# Patient Record
Sex: Female | Born: 1975
Health system: Southern US, Community
[De-identification: ages and names within clinical notes are randomized; demographics above are authoritative.]

## PROBLEM LIST (undated history)

## (undated) ENCOUNTER — Inpatient Hospital Stay (HOSPITAL_COMMUNITY): Payer: Self-pay

## (undated) DIAGNOSIS — G43909 Migraine, unspecified, not intractable, without status migrainosus: Secondary | ICD-10-CM

## (undated) DIAGNOSIS — J189 Pneumonia, unspecified organism: Secondary | ICD-10-CM

## (undated) DIAGNOSIS — J40 Bronchitis, not specified as acute or chronic: Secondary | ICD-10-CM

## (undated) DIAGNOSIS — N200 Calculus of kidney: Secondary | ICD-10-CM

## (undated) DIAGNOSIS — Z319 Encounter for procreative management, unspecified: Secondary | ICD-10-CM

## (undated) HISTORY — PX: WISDOM TOOTH EXTRACTION: SHX21

## (undated) HISTORY — PX: KIDNEY STONE SURGERY: SHX686

## (undated) HISTORY — DX: Bronchitis, not specified as acute or chronic: J40

## (undated) HISTORY — DX: Migraine, unspecified, not intractable, without status migrainosus: G43.909

## (undated) HISTORY — DX: Pneumonia, unspecified organism: J18.9

---

## 2000-10-05 ENCOUNTER — Inpatient Hospital Stay (HOSPITAL_COMMUNITY): Admission: AD | Admit: 2000-10-05 | Discharge: 2000-10-05 | Payer: Self-pay | Admitting: Obstetrics

## 2013-10-06 DIAGNOSIS — O4112 Chorioamnionitis: Secondary | ICD-10-CM

## 2013-10-06 DIAGNOSIS — O139 Gestational [pregnancy-induced] hypertension without significant proteinuria, unspecified trimester: Secondary | ICD-10-CM

## 2015-10-01 ENCOUNTER — Ambulatory Visit (INDEPENDENT_AMBULATORY_CARE_PROVIDER_SITE_OTHER): Payer: Managed Care, Other (non HMO) | Admitting: Family Medicine

## 2015-10-01 VITALS — BP 104/66 | HR 80 | Temp 98.6°F | Resp 18 | Ht 67.0 in | Wt 135.0 lb

## 2015-10-01 DIAGNOSIS — J0101 Acute recurrent maxillary sinusitis: Secondary | ICD-10-CM | POA: Diagnosis not present

## 2015-10-01 DIAGNOSIS — J411 Mucopurulent chronic bronchitis: Secondary | ICD-10-CM | POA: Diagnosis not present

## 2015-10-01 MED ORDER — FLUCONAZOLE 150 MG PO TABS: 150.0000 mg | ORAL_TABLET | Freq: Once | ORAL | 0 refills | Status: AC

## 2015-10-01 MED ORDER — AMOXICILLIN-POT CLAVULANATE 875-125 MG PO TABS: 1.0000 | ORAL_TABLET | Freq: Two times a day (BID) | ORAL | 0 refills | Status: DC

## 2015-10-01 NOTE — Progress Notes (Signed)
   HPI  Patient presents today here with possible sinus infection and to establish care.   Pt explains she has had cough (resolving) congestion, ear pain, muffled hearing, and BL facial pain for 7 days. Her facial pain is persistent and worsening. She is tolerating fluids easily.   She denies fever. She had severe sore throat which has resolved.   She explains symps started after being around a nephew at the beach, several others are sick as well.   PMH: Smoking status noted Pt states that she has had recurrent bronchitis requiring hospitalization 3 times this year, she has had PFTs in [place] without explanation.  Heart disease in father, Never smoker, no alcohol use.  ROS: Per HPI  Objective: BP 104/66   Pulse 80   Temp 98.6 F (37 C) (Oral)   Resp 18   Ht 5\' 7"  (1.702 m)   Wt 135 lb (61.2 kg)   LMP 09/25/2015   SpO2 100%   BMI 21.14 kg/m  Gen: NAD, alert, cooperative with exam HEENT: NCAT, BL maxillary and frontal sinus tendernes sto palp, TMS WNL BL, small amount of layering fluid on L, nares erythemetous Oropharynx clear CV: RRR, good S1/S2, no murmur Resp: CTABL, no wheezes, non-labored Ext: No edema, warm Neuro: Alert and oriented, No gross deficits  Assessment and plan:  # acute pansinusitis Treat with augmentin Diflucan for yeast infections, Pt states she gets very bad yeast infections with antibiotics Flonase or neti pot, RTC as needed  # Recurrent bronchitis Requiring Hospitalization X 3 this year, refer to pulmonology to discuss possible underlying lung disease Lung exam reassuring today.      Orders Placed This Encounter  Procedures  . Ambulatory referral to Pulmonology    Referral Priority:   Routine    Referral Type:   Consultation    Referral Reason:   Specialty Services Required    Requested Specialty:   Pulmonary Disease    Number of Visits Requested:   1    Meds ordered this encounter  Medications  . topiramate (TOPAMAX) 100 MG  tablet    Sig: Take 100 mg by mouth 2 (two) times daily.  . valACYclovir (VALTREX) 500 MG tablet    Sig: Take 500 mg by mouth 2 (two) times daily.  Marland Kitchen amoxicillin-clavulanate (AUGMENTIN) 875-125 MG tablet    Sig: Take 1 tablet by mouth 2 (two) times daily.    Dispense:  20 tablet    Refill:  0  . fluconazole (DIFLUCAN) 150 MG tablet    Sig: Take 1 tablet (150 mg total) by mouth once. Repeat in 3 days    Dispense:  3 tablet    Refill:  0    Kevin Fenton, MD 11:03 AM

## 2015-10-01 NOTE — Patient Instructions (Addendum)
  Great to meet you!   Sinusitis, Adult Sinusitis is redness, soreness, and puffiness (inflammation) of the air pockets in the bones of your face (sinuses). The redness, soreness, and puffiness can cause air and mucus to get trapped in your sinuses. This can allow germs to grow and cause an infection.  HOME CARE   Drink enough fluids to keep your pee (urine) clear or pale yellow.  Use a humidifier in your home.  Run a hot shower to create steam in the bathroom. Sit in the bathroom with the door closed. Breathe in the steam 3-4 times a day.  Put a warm, moist washcloth on your face 3-4 times a day, or as told by your doctor.  Use salt water sprays (saline sprays) to wet the thick fluid in your nose. This can help the sinuses drain.  Only take medicine as told by your doctor. GET HELP RIGHT AWAY IF:   Your pain gets worse.  You have very bad headaches.  You are sick to your stomach (nauseous).  You throw up (vomit).  You are very sleepy (drowsy) all the time.  Your face is puffy (swollen).  Your vision changes.  You have a stiff neck.  You have trouble breathing. MAKE SURE YOU:   Understand these instructions.  Will watch your condition.  Will get help right away if you are not doing well or get worse.   This information is not intended to replace advice given to you by your health care provider. Make sure you discuss any questions you have with your health care provider.   Document Released: 08/11/2007 Document Revised: 03/15/2014 Document Reviewed: 09/28/2011 Elsevier Interactive Patient Education 2016 [employer].      IF you received an x-ray today, you will receive an invoice from [facility]. Please contact [facility] at [phone] with questions or concerns regarding your invoice.   IF you received labwork today, you will receive an invoice from [employer]. Please contact [facility] at [phone] with  questions or concerns regarding your invoice.   Our billing staff will not be able to assist you with questions regarding bills from these companies.  You will be contacted with the lab results as soon as they are available. The fastest way to get your results is to activate your My Chart account. Instructions are located on the last page of this paperwork. If you have not heard from [place] regarding the results in 2 weeks, please contact this office.

## 2016-03-09 ENCOUNTER — Ambulatory Visit (INDEPENDENT_AMBULATORY_CARE_PROVIDER_SITE_OTHER): Payer: Managed Care, Other (non HMO) | Admitting: Physician Assistant

## 2016-03-09 VITALS — BP 122/80 | HR 84 | Temp 99.2°F | Resp 18 | Ht 67.0 in | Wt 143.0 lb

## 2016-03-09 DIAGNOSIS — Z3200 Encounter for pregnancy test, result unknown: Secondary | ICD-10-CM

## 2016-03-09 DIAGNOSIS — J3489 Other specified disorders of nose and nasal sinuses: Secondary | ICD-10-CM

## 2016-03-09 DIAGNOSIS — J069 Acute upper respiratory infection, unspecified: Secondary | ICD-10-CM | POA: Diagnosis not present

## 2016-03-09 DIAGNOSIS — R05 Cough: Secondary | ICD-10-CM

## 2016-03-09 DIAGNOSIS — R059 Cough, unspecified: Secondary | ICD-10-CM

## 2016-03-09 LAB — POCT CBC
GRANULOCYTE PERCENT: 79.4 % (ref 37–80)
HCT, POC: 40.4 % (ref 37.7–47.9)
Hemoglobin: 14.4 g/dL (ref 12.2–16.2)
Lymph, poc: 0.9 (ref 0.6–3.4)
MCH, POC: 32.9 pg — AB (ref 27–31.2)
MCHC: 35.7 g/dL — AB (ref 31.8–35.4)
MCV: 92.3 fL (ref 80–97)
MID (cbc): 0.4 (ref 0–0.9)
MPV: 6.8 fL (ref 0–99.8)
PLATELET COUNT, POC: 174 10*3/uL (ref 142–424)
POC Granulocyte: 4.8 (ref 2–6.9)
POC LYMPH %: 14.2 % (ref 10–50)
POC MID %: 6.4 %M (ref 0–12)
RBC: 4.38 M/uL (ref 4.04–5.48)
RDW, POC: 12.5 %
WBC: 6.1 10*3/uL (ref 4.6–10.2)

## 2016-03-09 LAB — POCT URINE PREGNANCY: Preg Test, Ur: NEGATIVE

## 2016-03-09 NOTE — Progress Notes (Signed)
MRN: [id number] DOB: November 27, 1975  Subjective:   Amy Yang is a 41 y.o. female presenting for chief complaint of Cough (patient states she may have bronchitits; started on last sat and getting worse) and Possible Pregnancy (patient wants to know if she is pregnant) .  Reports 5 day history of sinus congestion, rhinorrhea, ear pain, sore throat, dry cough (no hemoptysis), wheezing and shortness of breath, night sweats and chills. She now has a hoarse voice. Has not tried anything for relief as she thought she might be pregnant. Denies chest pain and myalgia, nausea, vomiting, abdominal pain and diarrhea. Has not had sick contact with anyone. No history of seasonal allergies or history of asthma. Patient has had flu shot this season. Denies smoking. Pt has history of bronchitis and pneumonia diagnosed without CXR.   Pt is actively trying to become pregnant. Her LMP 02/27/16  Amil AmenJulia has a current medication list which includes the following prescription(s): valacyclovir. Also is allergic to morphine and related; rhinocort [budesonide]; and symbicort [budesonide-formoterol fumarate].  Amil AmenJulia  has a past medical history of Bronchitis; Migraine; and Pneumonia. Also  has no past surgical history on file.   Objective:   Vitals: BP 122/80   Pulse 84   Temp 99.2 F (37.3 C) (Oral)   Resp 18   Ht 5\' 7"  (1.702 m)   Wt 143 lb (64.9 kg)   LMP 02/17/2016   SpO2 100%   BMI 22.40 kg/m   Physical Exam  Constitutional: She is oriented to person, place, and time. She appears well-developed and well-nourished. She appears distressed (mild, hacking cough persisting throughout exam).  HENT:  Head: Normocephalic and atraumatic.  Nose: Rhinorrhea present. Right sinus exhibits no maxillary sinus tenderness and no frontal sinus tenderness. Left sinus exhibits no maxillary sinus tenderness and no frontal sinus tenderness.  Mouth/Throat: Uvula is midline, oropharynx is clear and moist and mucous membranes  are normal. No tonsillar exudate.  Eyes: Conjunctivae are normal.  Neck: Normal range of motion.  Cardiovascular: Normal rate, regular rhythm and normal heart sounds.   Pulmonary/Chest: Effort normal and breath sounds normal.  Neurological: She is alert and oriented to person, place, and time.  Skin: Skin is warm and dry.  Psychiatric: She has a normal mood and affect.  Vitals reviewed.  Results for orders placed or performed in visit on 03/09/16 (from the past 24 hour(s))  POCT urine pregnancy     Status: None   Collection Time: 03/09/16 10:06 AM  Result Value Ref Range   Preg Test, Ur Negative Negative  POCT CBC     Status: Abnormal   Collection Time: 03/09/16 10:54 AM  Result Value Ref Range   WBC 6.1 4.6 - 10.2 K/uL   Lymph, poc 0.9 0.6 - 3.4   POC LYMPH PERCENT 14.2 10 - 50 %L   MID (cbc) 0.4 0 - 0.9   POC MID % 6.4 0 - 12 %M   POC Granulocyte 4.8 2 - 6.9   Granulocyte percent 79.4 37 - 80 %G   RBC 4.38 4.04 - 5.48 M/uL   Hemoglobin 14.4 12.2 - 16.2 g/dL   HCT, POC 40.940.4 81.137.7 - 47.9 %   MCV 92.3 80 - 97 fL   MCH, POC 32.9 (A) 27 - 31.2 pg   MCHC 35.7 (A) 31.8 - 35.4 g/dL   RDW, POC 91.412.5 %   Platelet Count, POC 174 142 - 424 K/uL   MPV 6.8 0 - 99.8 fL  Assessment and Plan :  1. Possible pregnancy - POCT urine pregnancy  2. Cough - POCT CBC  3. Rhinorrhea 4. Acute upper respiratory infection Pt reassured that her physical exam and lab findings support a viral etiology and that she can be treated symptomatically as she has not tried any medications at this point due to her unknown pregnancy status. She is actively trying to become pregnant. I recommended that we treat her symptoms with supportive care. Pt instructed to return to clinic if symptoms worsen, do not improve in 5-7 days, or as needed  Benjiman Core, PA-C  [facility] 03/09/2016 11:07 AM

## 2016-03-09 NOTE — Patient Instructions (Addendum)
-   We will treat this as a respiratory viral infection.  - I recommend you rest, drink plenty of fluids, eat light meals including soups.  - You may use sudafed for your congestion. For your cough, I recommend drinking warm tea with honey and lemon and using a humidifier in your room at night time.  - You may also use Tylenol over-the-counter for your sore throat.  - Please let me know if you are not seeing any improvement or get worse in 5-7 days.     Get ample rest - Take naps, sleep through the night, and sit down to relax. These are great ways to give your body much needed down time. Learn more about the importance of bed rest during pregnancy. Drink plenty of fluids - Drink water, juice, or broth to add necessary fluids back into your body. Eat well - Even if you cannot stomach larger meals, try eating small portions often. For your own comfort, it is important that you treat the symptoms associated with your cold or a cough.  Natural remedies to some of your most bothersome symptoms include:  Reduce congestion - Place a humidifier in your room, keep your head elevated on your pillow while resting, or use nasal strips. Alleviate your sore throat - Suck on ice chips, drink warm tea, or gargle with warm salt water.  IF you received an x-ray today, you will receive an invoice from [facility]. Please contact [facility] at [phone] with questions or concerns regarding your invoice.   IF you received labwork today, you will receive an invoice from [facility]. Please contact [facility] at [phone] with questions or concerns regarding your invoice.   Our billing staff will not be able to assist you with questions regarding bills from these companies.  You will be contacted with the lab results as soon as they are available. The fastest way to get your results is to activate your My Chart account. Instructions are located on the last page of this paperwork. If you have not  heard from us regarding the results in 2 weeks, please contact this office.

## 2016-04-12 ENCOUNTER — Telehealth: Payer: Self-pay

## 2016-04-12 ENCOUNTER — Encounter: Payer: Self-pay | Admitting: Neurology

## 2016-04-12 ENCOUNTER — Ambulatory Visit (INDEPENDENT_AMBULATORY_CARE_PROVIDER_SITE_OTHER): Payer: Managed Care, Other (non HMO) | Admitting: Neurology

## 2016-04-12 VITALS — BP 116/70 | HR 71 | Ht 67.0 in | Wt 142.0 lb

## 2016-04-12 DIAGNOSIS — G43709 Chronic migraine without aura, not intractable, without status migrainosus: Secondary | ICD-10-CM

## 2016-04-12 NOTE — Telephone Encounter (Signed)
Spoke to patient. Informed placed order to have magnesium level checked in 3 months. Patient will call to be placed on schedule a in 3 months and f/u soon after. Dr. Everlena CooperJaffe would like for her to take magnesium 400mg  qd instead of bid as noted. Patient verbalized understanding.

## 2016-04-12 NOTE — Progress Notes (Signed)
NEUROLOGY CONSULTATION NOTE  Amy Yang MRN: [id number] DOB: 07-05-1975  Referring provider: Dr. Valentina LucksGriffin Primary care provider: Dr. Valentina LucksGriffin  Reason for consult:  Migraines.  HISTORY OF PRESENT ILLNESS: Amy Yang is a 41 year old female who presents for migraines.  History, including past medication, supplemented by PCP note.  Onset:  Since 5th grade. Location:  Either side Quality:  Throbbing/stabbing Intensity:  8/10 Aura:  no Prodrome:  no Associated symptoms:  Nausea, photophobia.  Some blurred vision.  No vomiting.  She has not had any new worse headache of her life, waking up from sleep Duration:  Up to 48 hours Frequency:  6 days a month of severe migraine but has daily headache Frequency of abortive medication: only as needed. Triggers/exacerbating factors:  Change in weather, hormonal/menstrual cycle, caffeine Relieving factors:  Massage, ice, Tylenol, Bendaryl Activity: aggravates headache.  Needs to lay down for severe migraines. She does have daily bi-temporal non-throbbing vice-like headache as well.  Past NSAIDS:  Ibuprofen, naproxen Past analgesics:  Tramadol, Fioricet Past abortive triptans:  Sumatriptan (tablet/NS/[place]), Relpax, Zomig.  Treximet effective. Past antihypertensive medications:  no Past antidepressant medications:  Nortriptyline, venlafaxine Past anticonvulsant medications:  Topiramate 100mg  (effective) Past vitamins/Herbal/Supplements:  no Other past therapies:  Botox (effective), trigger point injections, biofeedback  Topiramate and Botox effective as preventative.  Treximet effective as abortive.  Stopped last Fall due to now trying to get pregnant. Current NSAIDS:  no Current analgesics:  Tylenol Current triptans:  no Current anti-emetic:  no Current muscle relaxants:  no Current anti-anxiolytic:  no Current sleep aide:  no Current Antihypertensive medications:  no Current Antidepressant medications:  no Current Anticonvulsant  medications:  no Current Vitamins/Herbal/Supplements:  Magnesium, vitamin B6 Current Antihistamines/Decongestants:  Benadryl Other therapy:  Acupuncture  Caffeine:  Highly sensitive to caffeine and headache trigger.  Does drink black tea. Alcohol:  no Smoker:  no Diet:  Needs to increase water intake Exercise:  routine Depression/anxiety:  okay Sleep hygiene:  good Family history of headache:  no  PAST MEDICAL HISTORY: Past Medical History:  Diagnosis Date  . Bronchitis   . Migraine   . Pneumonia     PAST SURGICAL HISTORY: No past surgical history on file.  MEDICATIONS: Current Outpatient Prescriptions on File Prior to Visit  Medication Sig Dispense Refill  . valACYclovir (VALTREX) 500 MG tablet Take 500 mg by mouth 2 (two) times daily.     No current facility-administered medications on file prior to visit.     ALLERGIES: Allergies  Allergen Reactions  . Morphine And Related Nausea And Vomiting  . Rhinocort [Budesonide]   . Symbicort [Budesonide-Formoterol Fumarate] Swelling    FAMILY HISTORY: Family History  Problem Relation Age of Onset  . Heart disease Father   . Cancer Maternal Grandmother   . Cancer Maternal Grandfather   . Cancer Paternal Grandmother     SOCIAL HISTORY: Social History   Social History  . Marital status: Married    Spouse name: N/A  . Number of children: N/A  . Years of education: N/A   Occupational History  . Not on file.   Social History Main Topics  . Smoking status: Never Smoker  . Smokeless tobacco: Never Used  . Alcohol use Yes  . Drug use: No  . Sexual activity: Not on file   Other Topics Concern  . Not on file   Social History Narrative  . No narrative on file    REVIEW OF SYSTEMS: Constitutional: No fevers, chills,  or sweats, no generalized fatigue, change in appetite Eyes: No visual changes, double vision, eye pain Ear, nose and throat: No hearing loss, ear pain, nasal congestion, sore  throat Cardiovascular: No chest pain, palpitations Respiratory:  No shortness of breath at rest or with exertion, wheezes GastrointestinaI: No nausea, vomiting, diarrhea, abdominal pain, fecal incontinence Genitourinary:  No dysuria, urinary retention or frequency Musculoskeletal:  No neck pain, back pain Integumentary: No rash, pruritus, skin lesions Neurological: as above Psychiatric: No depression, insomnia, anxiety Endocrine: No palpitations, fatigue, diaphoresis, mood swings, change in appetite, change in weight, increased thirst Hematologic/Lymphatic:  No purpura, petechiae. Allergic/Immunologic: no itchy/runny eyes, nasal congestion, recent allergic reactions, rashes  PHYSICAL EXAM: Vitals:   04/12/16 0807  BP: 116/70  Pulse: 71   General: No acute distress.  Patient appears well-groomed.  Head:  Normocephalic/atraumatic Eyes:  fundi examined but not visualized Neck: supple, no paraspinal tenderness, full range of motion Back: No paraspinal tenderness Heart: regular rate and rhythm Lungs: Clear to auscultation bilaterally. Vascular: No carotid bruits. Neurological Exam: Mental status: alert and oriented to person, place, and time, recent and remote memory intact, fund of knowledge intact, attention and concentration intact, speech fluent and not dysarthric, language intact. Cranial nerves: CN I: not tested CN II: pupils equal, round and reactive to light, visual fields intact CN III, IV, VI:  full range of motion, no nystagmus, no ptosis CN V: facial sensation intact CN VII: upper and lower face symmetric CN VIII: hearing intact CN IX, X: gag intact, uvula midline CN XI: sternocleidomastoid and trapezius muscles intact CN XII: tongue midline Bulk & Tone: normal, no fasciculations. Motor:  5/5 throughout  Sensation: temperature and vibration sensation intact. Deep Tendon Reflexes:  2+ throughout, toes downgoing.  Finger to nose testing:  Without dysmetria.  Heel to  shin:  Without dysmetria.  Gait:  Normal station and stride.  Able to turn and tandem walk. Romberg negative.  IMPRESSION: Chronic migraine without aura.  PLAN: 1.  Given that she is trying to get pregnant, I would not proceed with Botox and definitely not topiramate.  Instead, she will try magnesium citrate daily and riboflavin 400mg  daily. 2.  She will continue Tylenol as needed 3.  Check magnesium level in 3 months with follow up soon afterwards.  Thank you for allowing me to take part in the care of this patient.  Shon Millet, DO  CC:  Maurice Small, MD

## 2016-04-12 NOTE — Patient Instructions (Signed)
As per my research, Botox is safe during pregnancy but I will investigate further.  In the meantime, we will set up for pre-authorization.     Recurrent Migraine Headache Migraines are a type of headache, and they are usually stronger and more sudden than normal headaches (tension headaches). Migraines are characterized by an intense pulsing, throbbing pain that is usually only present on one side of the head. Sometimes, migraine headaches can cause nausea, vomiting, sensitivity to light and sound, and vision changes. Recurrent migraines keep coming back (recurring). A migraine can last from 4 hours up to 3 days. What are the causes? The exact cause of this condition is not known. However, a migraine may be caused when nerves in the brain become irritated and release chemicals that cause inflammation of blood vessels. This inflammation causes pain. Certain things may also trigger migraines, such as:  A disruption in your regular eating and sleeping schedule.  Smoking.  Stress.  Menstruation.  Certain foods and drinks, such as:  Aged cheese.  Chocolate.  Alcohol.  Caffeine.  Foods or drinks that contain nitrates, glutamate, aspartame, MSG, or tyramine.  Lack of sleep.  Hunger.  Physical exertion.  Fatigue.  High altitude.  Weather changes.  Medicines, such as:  Nitroglycerin, which is used to treat chest pain.  Birth control pills.  Estrogen.  Some blood pressure medicines. What are the signs or symptoms? Symptoms of this condition vary for each person and may include:  Pain that is usually only present on one side of the head. In some cases, the pain may be on both sides of the head or around the head or neck.  Pulsating or throbbing pain.  Severe pain that prevents daily activities.  Pain that is aggravated by any physical activity.  Nausea, vomiting, or both.  Dizziness.  Pain with exposure to bright lights, loud noises, or activity.  General  sensitivity to bright lights, loud noises, or smells. Before you get a migraine, you may get warning signs that a migraine is coming (aura). An aura may include:  Seeing flashing lights.  Seeing bright spots, halos, or zigzag lines.  Having tunnel vision or blurred vision.  Having numbness or a tingling feeling.  Having trouble talking.  Having muscle weakness.  Smelling a certain odor. How is this diagnosed? This condition is often diagnosed based on:  Your symptoms and medical history.  A physical exam. You may also have tests, including:  A CT scan or MRI of your brain. These imaging tests cannot diagnose migraines, but they can help to rule out other causes of headaches.  Blood tests. How is this treated? This condition is treated with:  Medicines. These are used for:  Lessening pain and nausea.  Preventing recurrent migraines.  Lifestyle changes, such as changes to your diet or sleeping patterns.  Behavior therapy, such as relaxation training or biofeedback. Biofeedback is a treatment that involves teaching you to relax and use your brain to lower your heart rate and control your breathing. Follow these instructions at home: Medicines  Take over-the-counter and prescription medicines only as told by your health care provider.  Do not drive or use heavy machinery while taking prescription pain medicine. Lifestyle  Do not use any products that contain nicotine or tobacco, such as cigarettes and e-cigarettes. If you need help quitting, ask your health care provider.  Limit alcohol intake to no more than 1 drink a day for nonpregnant women and 2 drinks a day for men. One drink  equals 12 oz of beer, 5 oz of wine, or 1 oz of hard liquor.  Get 7-9 hours of sleep each night, or the amount of sleep recommended by your health care provider.  Limit your stress. Talk with your health care provider if you need help with stress management.  Maintain a healthy weight. If  you need help losing weight, ask your health care provider.  Exercise regularly. Aim for 150 minutes of moderate-intensity exercise (walking, biking, yoga) or 75 minutes of vigorous exercise (running, circuit training, swimming) each week. General instructions  Keep a journal to find out what triggers your migraine headaches so you can avoid these triggers. For example, write down:  What you eat and drink.  How much sleep you get.  Any change to your diet or medicines.  Lie down in a dark, quiet room when you have a migraine.  Try placing a cool towel over your head when you have a migraine.  Keep lights dim, if bright lights bother you and make your migraines worse.  Keep all follow-up visits as told by your health care provider. This is important. Contact a health care provider if:  Your pain does not improve, even with medicine.  Your migraines continue to return, even with medicine.  You have a fever.  You have weight loss. Get help right away if:  Your migraine becomes severe and medicine does not help.  You have a stiff neck.  You have a loss of vision.  You have muscle weakness or loss of muscle control.  You start losing your balance or have trouble walking.  You feel faint or you pass out.  You develop new, severe symptoms.  You start having abrupt severe headaches that last for a second or less, like a thunderclap. Summary  Migraine headaches are usually stronger and more sudden than normal headaches (tension headaches). Migraines are characterized by an intense pulsing, throbbing pain that is usually only present on one side of the head.  The exact cause of this condition is not known. However, a migraine may be caused when nerves in the brain become irritated and release chemicals that cause inflammation of blood vessels.  Certain things may trigger migraines, such as changes to diet or sleeping patterns, smoking, certain foods, alcohol, stress, and  certain medicines.  Sometimes, migraine headaches can cause nausea, vomiting, sensitivity to light and sound, and vision changes.  Migraines are often diagnosed based on your symptoms, medical history, and a physical exam. This information is not intended to replace advice given to you by your health care provider. Make sure you discuss any questions you have with your health care provider. Document Released: 11/17/2000 Document Revised: 12/05/2015 Document Reviewed: 12/05/2015 Elsevier Interactive Patient Education  2017 [employer]

## 2017-02-12 ENCOUNTER — Emergency Department (HOSPITAL_COMMUNITY)
Admission: EM | Admit: 2017-02-12 | Discharge: 2017-02-12 | Disposition: A | Discharge status: Home / Self Care | Payer: 59 | Attending: Emergency Medicine | Admitting: Emergency Medicine

## 2017-02-12 ENCOUNTER — Encounter (HOSPITAL_COMMUNITY): Payer: Self-pay | Admitting: Emergency Medicine

## 2017-02-12 DIAGNOSIS — L089 Local infection of the skin and subcutaneous tissue, unspecified: Secondary | ICD-10-CM

## 2017-02-12 DIAGNOSIS — B001 Herpesviral vesicular dermatitis: Secondary | ICD-10-CM | POA: Insufficient documentation

## 2017-02-12 DIAGNOSIS — R220 Localized swelling, mass and lump, head: Secondary | ICD-10-CM | POA: Diagnosis present

## 2017-02-12 MED ORDER — TOBRAMYCIN-DEXAMETHASONE 0.3-0.1 % OP OINT: 1.0000 "application " | TOPICAL_OINTMENT | Freq: Three times a day (TID) | OPHTHALMIC | 0 refills | Status: DC

## 2017-02-12 MED ORDER — VALACYCLOVIR HCL 1 G PO TABS: 1000.0000 mg | ORAL_TABLET | Freq: Three times a day (TID) | ORAL | 0 refills | Status: AC

## 2017-02-12 NOTE — ED Triage Notes (Signed)
Pt states she noticed a bump yesterday to left eye lid/nose, thought it was a pimple and tried to pop it but nothing came out. Pt then noticed swelling to left eye lid today. Pt gets some "spots" in vision. Pt able to raise eye brows and has no mouth facial droop. No neurological deficits. Pt states the swelling to eye lid hurts.

## 2017-02-12 NOTE — ED Provider Notes (Signed)
[facility] EMERGENCY [facility]PARTMENT Provider Note   CSN: [id number] Arrival date & time: 02/12/17  [id number]     History   Chief Complaint Chief Complaint  Patient presents with  . Eye Pain    HPI Amy LunaJulia E Yang is a 41 y.o. female. Chief complaint is sore spot near left eye, and cold sore.  HPI: 41 year old female. History of previous cold sores. Thinks that she is getting one on her left upper lip. Also noted a spot on the skin adjacent her medial left eye.   Past Medical History:  Diagnosis Date  . Bronchitis   . Migraine   . Pneumonia     There are no active problems to display for this patient.   No past surgical history on file.  OB History    No data available       Home Medications    Prior to Admission medications   Medication Sig Start Date End Date Taking? Authorizing Provider  tobramycin-dexamethasone [facility](TOBRADEX) ophthalmic ointment Place 1 application into the left eye 3 (three) times daily. Place onto lesion adjacent your left eye 3 times a day 02/12/17   Rolland PorterJames, Bianca Raneri, MD  valACYclovir (VALTREX) 1000 MG tablet Take 1 tablet (1,000 mg total) by mouth 3 (three) times daily for 14 days. 02/12/17 02/26/17  Rolland PorterJames, Dakota Stangl, MD    Family History Family History  Problem Relation Age of Onset  . Heart disease Father   . Cancer Maternal Grandmother   . Cancer Maternal Grandfather   . Cancer Paternal Grandmother     Social History Social History   Tobacco Use  . Smoking status: Never Smoker  . Smokeless tobacco: Never Used  Substance Use Topics  . Alcohol use: Yes  . Drug use: No     Allergies   Morphine and related; Rhinocort [budesonide]; and Symbicort [budesonide-formoterol fumarate]   Review of Systems Review of Systems  Constitutional: Negative for appetite change, chills, diaphoresis, fatigue and fever.  HENT: Negative for mouth sores, sore throat and trouble swallowing.        Left upper lip lesion/tingling.   Eyes: Negative for  visual disturbance.       Skin "spot" on medial left nose adjacent eye.  Respiratory: Negative for cough, chest tightness, shortness of breath and wheezing.   Cardiovascular: Negative for chest pain.  Gastrointestinal: Negative for abdominal distention, abdominal pain, diarrhea, nausea and vomiting.  Endocrine: Negative for polydipsia, polyphagia and polyuria.  Genitourinary: Negative for dysuria, frequency and hematuria.  Musculoskeletal: Negative for gait problem.  Skin: Negative for color change, pallor and rash.  Neurological: Negative for dizziness, syncope, light-headedness and headaches.  Hematological: Does not bruise/bleed easily.  Psychiatric/Behavioral: Negative for behavioral problems and confusion.     Physical Exam Updated Vital Signs BP 116/81   Pulse 72   Temp 98 F (36.7 C)   Resp 18   Ht 5\' 7"  (1.702 m)   Wt 67.1 kg (148 lb)   LMP 01/20/2017   SpO2 98%   BMI 23.18 kg/m   Physical Exam  Constitutional: She is oriented to person, place, and time. She appears well-developed and well-nourished. No distress.  HENT:  Head: Normocephalic.  Small macules/early pustule on medial left nasal bridge. Does not contact the eyelid, or medial canthus. No additional lesions or skin sterilely lateral to the eye to suggest zoster. Has lesion on left lobe consistent with early herpes labialis oris.  Eyes: Conjunctivae are normal. Pupils are equal, round, and reactive to light.  No scleral icterus.  Neck: Normal range of motion. Neck supple. No thyromegaly present.  Cardiovascular: Normal rate and regular rhythm. Exam reveals no gallop and no friction rub.  No murmur heard. Pulmonary/Chest: Effort normal and breath sounds normal. No respiratory distress. She has no wheezes. She has no rales.  Abdominal: Soft. Bowel sounds are normal. She exhibits no distension. There is no tenderness. There is no rebound.  Musculoskeletal: Normal range of motion.  Neurological: She is alert and  oriented to person, place, and time.  Skin: Skin is warm and dry. No rash noted.  Psychiatric: She has a normal mood and affect. Her behavior is normal.     ED Treatments / Results  Labs (all labs ordered are listed, but only abnormal results are displayed) Labs Reviewed - No data to display  EKG  EKG Interpretation None       Radiology No results found.  Procedures Procedures (including critical care time)  Medications Ordered in ED Medications - No data to display   Initial Impression / Assessment and Plan / ED Course  I have reviewed the triage vital signs and the nursing notes.  Pertinent labs & imaging results that were available during my care of the patient were reviewed by me and considered in my medical decision making (see chart for details).    Will treat her culture was Valtrex. I discussed the possibility of this being early zoster. I think this is very simple pimple or pustule of the skin. We'll prescribe topical Lidex ophthalmic to use on the skin, not in the eye. Continue the Valtrex until lip lesion is improving. If she develops additional lesions around the eye recheck with primary care and continue Valtrex.  Final Clinical Impressions(s) / ED Diagnoses   Final diagnoses:  Cold sore  Skin pustule    ED Discharge Orders        Ordered    valACYclovir (VALTREX) 1000 MG tablet  3 times daily     02/12/17 0949    tobramycin-dexamethasone (TOBRADEX) ophthalmic ointment  3 times daily     02/12/17 0949       Rolland PorterJames, Assata Juncaj, MD 02/12/17 1002

## 2017-03-08 NOTE — L&D Delivery Note (Signed)
Delivery Note At 4:13 PM a viable and healthy female was delivered via Vaginal, Spontaneous (Presentation: LOA  ).  APGAR: 7, 9; weight  pending.   Placenta status: spontaneous, intact.  Cord:  with the following complications: none.  Cord pH: na  Anesthesia:  Epidural and local Episiotomy: None Lacerations: 2nd degree;Vaginal Suture Repair: 2.0 vicryl rapide Est. Blood Loss (mL): 600  Mom to postpartum.  Baby to Couplet care / Skin to Skin.  Cristo Ausburn J 11/15/2017, 5:49 PM

## 2017-06-28 ENCOUNTER — Other Ambulatory Visit (HOSPITAL_COMMUNITY): Payer: Self-pay | Admitting: Obstetrics and Gynecology

## 2017-06-29 ENCOUNTER — Other Ambulatory Visit (HOSPITAL_COMMUNITY): Payer: Self-pay | Admitting: Obstetrics and Gynecology

## 2017-06-29 DIAGNOSIS — O09522 Supervision of elderly multigravida, second trimester: Secondary | ICD-10-CM

## 2017-06-29 DIAGNOSIS — Z3A18 18 weeks gestation of pregnancy: Secondary | ICD-10-CM

## 2017-06-29 DIAGNOSIS — Z3689 Encounter for other specified antenatal screening: Secondary | ICD-10-CM

## 2017-07-04 ENCOUNTER — Encounter (HOSPITAL_COMMUNITY): Payer: Self-pay

## 2017-07-06 ENCOUNTER — Other Ambulatory Visit: Payer: Self-pay

## 2017-07-06 ENCOUNTER — Encounter (HOSPITAL_COMMUNITY): Payer: Self-pay

## 2017-07-06 ENCOUNTER — Other Ambulatory Visit (HOSPITAL_COMMUNITY): Payer: Self-pay | Admitting: *Deleted

## 2017-07-06 ENCOUNTER — Ambulatory Visit (HOSPITAL_COMMUNITY)
Admission: RE | Admit: 2017-07-06 | Discharge: 2017-07-06 | Disposition: A | Discharge status: Home / Self Care | Payer: 59 | Source: Ambulatory Visit | Attending: Obstetrics and Gynecology | Admitting: Obstetrics and Gynecology

## 2017-07-06 DIAGNOSIS — Z3A18 18 weeks gestation of pregnancy: Secondary | ICD-10-CM | POA: Insufficient documentation

## 2017-07-06 DIAGNOSIS — O09292 Supervision of pregnancy with other poor reproductive or obstetric history, second trimester: Secondary | ICD-10-CM | POA: Insufficient documentation

## 2017-07-06 DIAGNOSIS — Z362 Encounter for other antenatal screening follow-up: Secondary | ICD-10-CM

## 2017-07-06 DIAGNOSIS — O09522 Supervision of elderly multigravida, second trimester: Secondary | ICD-10-CM

## 2017-07-06 DIAGNOSIS — O09812 Supervision of pregnancy resulting from assisted reproductive technology, second trimester: Secondary | ICD-10-CM | POA: Insufficient documentation

## 2017-07-06 DIAGNOSIS — Z363 Encounter for antenatal screening for malformations: Secondary | ICD-10-CM | POA: Diagnosis not present

## 2017-07-06 DIAGNOSIS — Z3689 Encounter for other specified antenatal screening: Secondary | ICD-10-CM

## 2017-07-06 HISTORY — DX: Calculus of kidney: N20.0

## 2017-07-07 ENCOUNTER — Other Ambulatory Visit (HOSPITAL_COMMUNITY): Payer: 59

## 2017-08-03 ENCOUNTER — Encounter (HOSPITAL_COMMUNITY): Payer: Self-pay

## 2017-08-03 ENCOUNTER — Ambulatory Visit (HOSPITAL_COMMUNITY)
Admission: RE | Admit: 2017-08-03 | Discharge: 2017-08-03 | Disposition: A | Discharge status: Home / Self Care | Payer: 59 | Source: Ambulatory Visit | Attending: Obstetrics and Gynecology | Admitting: Obstetrics and Gynecology

## 2017-10-07 ENCOUNTER — Encounter (HOSPITAL_COMMUNITY): Payer: Self-pay | Admitting: *Deleted

## 2017-10-07 ENCOUNTER — Inpatient Hospital Stay (HOSPITAL_COMMUNITY)
Admission: AD | Admit: 2017-10-07 | Discharge: 2017-10-07 | Disposition: A | Discharge status: Home / Self Care | Payer: 59 | Source: Ambulatory Visit | Attending: Obstetrics and Gynecology | Admitting: Obstetrics and Gynecology

## 2017-10-07 DIAGNOSIS — Z885 Allergy status to narcotic agent status: Secondary | ICD-10-CM | POA: Diagnosis not present

## 2017-10-07 DIAGNOSIS — N939 Abnormal uterine and vaginal bleeding, unspecified: Secondary | ICD-10-CM | POA: Diagnosis present

## 2017-10-07 DIAGNOSIS — O4693 Antepartum hemorrhage, unspecified, third trimester: Secondary | ICD-10-CM

## 2017-10-07 DIAGNOSIS — O4703 False labor before 37 completed weeks of gestation, third trimester: Secondary | ICD-10-CM | POA: Diagnosis not present

## 2017-10-07 DIAGNOSIS — Z79899 Other long term (current) drug therapy: Secondary | ICD-10-CM | POA: Diagnosis not present

## 2017-10-07 DIAGNOSIS — Z3A32 32 weeks gestation of pregnancy: Secondary | ICD-10-CM | POA: Diagnosis not present

## 2017-10-07 DIAGNOSIS — Z8249 Family history of ischemic heart disease and other diseases of the circulatory system: Secondary | ICD-10-CM | POA: Insufficient documentation

## 2017-10-07 DIAGNOSIS — Z7982 Long term (current) use of aspirin: Secondary | ICD-10-CM | POA: Diagnosis not present

## 2017-10-07 DIAGNOSIS — Z91018 Allergy to other foods: Secondary | ICD-10-CM | POA: Diagnosis not present

## 2017-10-07 DIAGNOSIS — Z825 Family history of asthma and other chronic lower respiratory diseases: Secondary | ICD-10-CM | POA: Insufficient documentation

## 2017-10-07 DIAGNOSIS — Z87442 Personal history of urinary calculi: Secondary | ICD-10-CM | POA: Diagnosis not present

## 2017-10-07 DIAGNOSIS — Z888 Allergy status to other drugs, medicaments and biological substances status: Secondary | ICD-10-CM | POA: Insufficient documentation

## 2017-10-07 LAB — URINALYSIS, ROUTINE W REFLEX MICROSCOPIC
BACTERIA UA: NONE SEEN
Bilirubin Urine: NEGATIVE
Glucose, UA: NEGATIVE mg/dL
Ketones, ur: NEGATIVE mg/dL
Nitrite: NEGATIVE
PH: 5 (ref 5.0–8.0)
Protein, ur: NEGATIVE mg/dL
Specific Gravity, Urine: 1.019 (ref 1.005–1.030)

## 2017-10-07 LAB — WET PREP, GENITAL
CLUE CELLS WET PREP: NONE SEEN
Sperm: NONE SEEN
TRICH WET PREP: NONE SEEN
YEAST WET PREP: NONE SEEN

## 2017-10-07 LAB — FETAL FIBRONECTIN: FETAL FIBRONECTIN: NEGATIVE

## 2017-10-07 MED ORDER — NIFEDIPINE 10 MG PO CAPS
10.0000 mg | ORAL_CAPSULE | ORAL | Status: AC
  Administered 2017-10-07 (×3): 10 mg via ORAL
  Filled 2017-10-07 (×2): qty 1

## 2017-10-07 MED ORDER — LACTATED RINGERS IV BOLUS
1000.0000 mL | Freq: Once | INTRAVENOUS | Status: AC
  Administered 2017-10-07: 1000 mL via INTRAVENOUS

## 2017-10-07 MED ORDER — NIFEDIPINE 10 MG PO CAPS: 10.0000 mg | ORAL_CAPSULE | Freq: Four times a day (QID) | ORAL | 1 refills | Status: DC

## 2017-10-07 MED ORDER — BETAMETHASONE SOD PHOS & ACET 6 (3-3) MG/ML IJ SUSP
12.0000 mg | Freq: Once | INTRAMUSCULAR | Status: AC
  Administered 2017-10-07: 12 mg via INTRAMUSCULAR
  Filled 2017-10-07: qty 2

## 2017-10-07 NOTE — MAU Note (Signed)
Pt reports vaginal bleeding, like a period since this am. Dark red in color, cramping off/on for a week. Positive fetal movement.

## 2017-10-07 NOTE — Discharge Instructions (Signed)
Braxton Hicks Contractions °Contractions of the uterus can occur throughout pregnancy, but they are not always a sign that you are in labor. You may have practice contractions called Braxton Hicks contractions. These false labor contractions are sometimes confused with true labor. °What are Braxton Hicks contractions? °Braxton Hicks contractions are tightening movements that occur in the muscles of the uterus before labor. Unlike true labor contractions, these contractions do not result in opening (dilation) and thinning of the cervix. Toward the end of pregnancy (32-34 weeks), Braxton Hicks contractions can happen more often and may become stronger. These contractions are sometimes difficult to tell apart from true labor because they can be very uncomfortable. You should not feel embarrassed if you go to the hospital with false labor. °Sometimes, the only way to tell if you are in true labor is for your health care provider to look for changes in the cervix. The health care provider will do a physical exam and may monitor your contractions. If you are not in true labor, the exam should show that your cervix is not dilating and your water has not broken. °If there are other health problems associated with your pregnancy, it is completely safe for you to be sent home with false labor. You may continue to have Braxton Hicks contractions until you go into true labor. °How to tell the difference between true labor and false labor °True labor °· Contractions last 30-70 seconds. °· Contractions become very regular. °· Discomfort is usually felt in the top of the uterus, and it spreads to the lower abdomen and low back. °· Contractions do not go away with walking. °· Contractions usually become more intense and increase in frequency. °· The cervix dilates and gets thinner. °False labor °· Contractions are usually shorter and not as strong as true labor contractions. °· Contractions are usually irregular. °· Contractions  are often felt in the front of the lower abdomen and in the groin. °· Contractions may go away when you walk around or change positions while lying down. °· Contractions get weaker and are shorter-lasting as time goes on. °· The cervix usually does not dilate or become thin. °Follow these instructions at home: °· Take over-the-counter and prescription medicines only as told by your health care provider. °· Keep up with your usual exercises and follow other instructions from your health care provider. °· Eat and drink lightly if you think you are going into labor. °· If Braxton Hicks contractions are making you uncomfortable: °? Change your position from lying down or resting to walking, or change from walking to resting. °? Sit and rest in a tub of warm water. °? Drink enough fluid to keep your urine pale yellow. Dehydration may cause these contractions. °? Do slow and deep breathing several times an hour. °· Keep all follow-up prenatal visits as told by your health care provider. This is important. °Contact a health care provider if: °· You have a fever. °· You have continuous pain in your abdomen. °Get help right away if: °· Your contractions become stronger, more regular, and closer together. °· You have fluid leaking or gushing from your vagina. °· You pass blood-tinged mucus (bloody show). °· You have bleeding from your vagina. °· You have low back pain that you never had before. °· You feel your baby’s head pushing down and causing pelvic pressure. °· Your baby is not moving inside you as much as it used to. °Summary °· Contractions that occur before labor are called Braxton   Hicks contractions, false labor, or practice contractions. °· Braxton Hicks contractions are usually shorter, weaker, farther apart, and less regular than true labor contractions. True labor contractions usually become progressively stronger and regular and they become more frequent. °· Manage discomfort from Braxton Hicks contractions by  changing position, resting in a warm bath, drinking plenty of water, or practicing deep breathing. °This information is not intended to replace advice given to you by your health care provider. Make sure you discuss any questions you have with your health care provider. °Document Released: 07/08/2016 Document Revised: 07/08/2016 Document Reviewed: 07/08/2016 °Elsevier Interactive Patient Education © 2018 [employer] ° °

## 2017-10-07 NOTE — MAU Provider Note (Signed)
History     CSN: [id number]  Arrival date and time: 10/07/17 4098   First Provider Initiated Contact with Patient 10/07/17 [phone]      Chief Complaint  Patient presents with  . Vaginal Bleeding   Amy Yang is a 42 y.o. G2P1001 at [redacted]w[redacted]d who presents today with vaginal bleeding. She was on her way to the hospital to visit her mother, and she noticed some discharge and urge to pee. At that time she saw some dark red blood and pink blood on her panti-linter. She states that the panti-liner was saturated. She has since had some pink spotting. She has had hx of PP pre-eclampsia with her last pregnancy. She reports that she was seen in the office last week. She was told she was closed. No history of placenta previa.   Vaginal Bleeding  The patient's primary symptoms include pelvic pain and vaginal bleeding. This is a new problem. The current episode started today. The problem occurs intermittently. The problem has been unchanged. The pain is mild (has had cramping off and on for about one week. ). She is pregnant. The vaginal discharge was bloody and mucoid. The vaginal bleeding is spotting. She has not been passing clots. She has not been passing tissue. Nothing aggravates the symptoms. She has tried nothing for the symptoms. Sexual activity: denies intercourse with in the last 24 hours.     OB History    Gravida  2   Para  1   Term  1   Preterm      AB      Living  1     SAB      TAB      Ectopic      Multiple      Live Births              Past Medical History:  Diagnosis Date  . Bronchitis   . Kidney stones   . Migraine   . Pneumonia     Past Surgical History:  Procedure Laterality Date  . KIDNEY STONE SURGERY    . WISDOM TOOTH EXTRACTION      Family History  Problem Relation Age of Onset  . Heart disease Father   . Asthma Sister   . Cancer Maternal Grandmother   . Cancer Maternal Grandfather   . Cancer Paternal Grandmother   . Diabetes Paternal  Grandmother   . Stroke Paternal Grandfather     Social History   Tobacco Use  . Smoking status: Never Smoker  . Smokeless tobacco: Never Used  Substance Use Topics  . Alcohol use: Yes  . Drug use: No    Allergies:  Allergies  Allergen Reactions  . Latex   . Morphine And Related Nausea And Vomiting  . Pineapple   . Rhinocort [Budesonide]   . Symbicort [Budesonide-Formoterol Fumarate] Swelling    Medications Prior to Admission  Medication Sig Dispense Refill Last Dose  . Acetaminophen (TYLENOL PO) Take by mouth.   Taking  . ASPIRIN 81 PO Take by mouth.   Taking  . Butalbital-APAP-Caffeine (FIORICET PO) Take by mouth.   Taking  . diphenhydrAMINE HCl (BENADRYL ALLERGY PO) Take by mouth.   Taking  . Melatonin 3 MG CAPS Take by mouth.   Taking  . Omega-3 Fatty Acids (FISH OIL PO) Take by mouth.   Taking  . Prenatal Vit-Fe Fumarate-FA (PRENATAL VITAMIN PO) Take by mouth.   Taking  . tobramycin-dexamethasone (TOBRADEX) ophthalmic ointment Place 1 application into  the left eye 3 (three) times daily. Place onto lesion adjacent your left eye 3 times a day (Patient not taking: Reported on 07/06/2017) 3.5 g 0 Not Taking  . valACYclovir HCl (VALTREX PO) Take by mouth.   Taking    Review of Systems  Genitourinary: Positive for pelvic pain and vaginal bleeding.   Physical Exam   Blood pressure 121/79, pulse 79, temperature 98.6 F (37 C), temperature source Oral, resp. rate 16, height 5\' 7"  (1.702 m), weight 165 lb (74.8 kg), SpO2 100 %.  Physical Exam  Nursing note and vitals reviewed. Constitutional: She is oriented to person, place, and time. She appears well-developed and well-nourished. No distress.  HENT:  Head: Normocephalic.  Cardiovascular: Normal rate.  Respiratory: Effort normal.  GI: Soft. There is no tenderness. There is no rebound.  Genitourinary:  Genitourinary Comments:  External: no lesion Vagina: small amount of pink/brown bloody mucous Cervix: pink, smooth,  1/60/-2  Uterus: AGA   Neurological: She is alert and oriented to person, place, and time.  Skin: Skin is warm and dry.  Psychiatric: She has a normal mood and affect.   NST:  Baseline: 140 Variability: moderate Accels: 15x15 Decels: none Toco: irregular    MAU Course  Procedures  MDM  Patient has 3 doses of procardia. Contractions have spaced. Patient reports less painful/frequent contractions. Cervical exam is unchanged.   12:05 PM Consult with Dr. Billy Coastaavon. Discussed presenting complaint, lab results and exam. Will give BMZ today and tomorrow. Patient to be DC home with procardia on a scheduled basis then can move to PRN after BMZ. FU in the office on Monday.   Assessment and Plan   1. Threatened premature labor affecting pregnancy, less than 37 weeks in third trimester, antepartum   2. [redacted] weeks gestation of pregnancy   3. Vaginal bleeding in pregnancy, third trimester    DC home Comfort measures reviewed  3rd Trimester precautions  Bleeding precautions PTL precautions  Fetal kick counts RX: procardia 10mg  Q 6 hours #60 Return to MAU as needed FU with OB as planned  Follow-up Information    Olivia Mackieaavon, Richard, MD Follow up.   Specialty:  Obstetrics and Gynecology Why:  he would like to see you monday for FU visit  Contact information: [address] [place] [id number] [phone]        [facility] Follow up.   Why:  Return tomorrow for second dose of steroids  Contact information: [address] [id number] [place] [id number] [phone]           Thressa ShellerHeather Hogan 10/07/2017, 9:29 AM

## 2017-10-08 ENCOUNTER — Encounter (HOSPITAL_COMMUNITY): Payer: Self-pay | Admitting: *Deleted

## 2017-10-08 ENCOUNTER — Inpatient Hospital Stay (HOSPITAL_COMMUNITY)
Admission: AD | Admit: 2017-10-08 | Discharge: 2017-10-08 | Disposition: A | Discharge status: Home / Self Care | Payer: 59 | Source: Ambulatory Visit | Attending: Obstetrics and Gynecology | Admitting: Obstetrics and Gynecology

## 2017-10-08 DIAGNOSIS — Z7982 Long term (current) use of aspirin: Secondary | ICD-10-CM | POA: Diagnosis not present

## 2017-10-08 DIAGNOSIS — Z3A32 32 weeks gestation of pregnancy: Secondary | ICD-10-CM | POA: Insufficient documentation

## 2017-10-08 DIAGNOSIS — O163 Unspecified maternal hypertension, third trimester: Secondary | ICD-10-CM

## 2017-10-08 DIAGNOSIS — Z3689 Encounter for other specified antenatal screening: Secondary | ICD-10-CM

## 2017-10-08 LAB — COMPREHENSIVE METABOLIC PANEL
ALBUMIN: 3.2 g/dL — AB (ref 3.5–5.0)
ALT: 20 U/L (ref 0–44)
ANION GAP: 11 (ref 5–15)
AST: 23 U/L (ref 15–41)
Alkaline Phosphatase: 83 U/L (ref 38–126)
BUN: 12 mg/dL (ref 6–20)
CO2: 19 mmol/L — ABNORMAL LOW (ref 22–32)
Calcium: 8.6 mg/dL — ABNORMAL LOW (ref 8.9–10.3)
Chloride: 104 mmol/L (ref 98–111)
Creatinine, Ser: 0.58 mg/dL (ref 0.44–1.00)
GFR calc Af Amer: >60 mL/min (ref >60)
GFR calc non Af Amer: >60 mL/min (ref >60)
Glucose, Bld: 134 mg/dL — ABNORMAL HIGH (ref 70–99)
POTASSIUM: 3.2 mmol/L — AB (ref 3.5–5.1)
SODIUM: 134 mmol/L — AB (ref 135–145)
TOTAL PROTEIN: 6.2 g/dL — AB (ref 6.5–8.1)
Total Bilirubin: 0.2 mg/dL — ABNORMAL LOW (ref 0.3–1.2)

## 2017-10-08 LAB — CBC
HCT: 33.9 % — ABNORMAL LOW (ref 36.0–46.0)
Hemoglobin: 12.1 g/dL (ref 12.0–15.0)
MCH: 33.9 pg (ref 26.0–34.0)
MCHC: 35.7 g/dL (ref 30.0–36.0)
MCV: 95 fL (ref 78.0–100.0)
PLATELETS: 151 10*3/uL (ref 150–400)
RBC: 3.57 MIL/uL — AB (ref 3.87–5.11)
RDW: 12.7 % (ref 11.5–15.5)
WBC: 10.1 10*3/uL (ref 4.0–10.5)

## 2017-10-08 LAB — PROTEIN / CREATININE RATIO, URINE
Creatinine, Urine: 107 mg/dL
PROTEIN CREATININE RATIO: 0.08 mg/mg{creat} (ref 0.00–0.15)
Total Protein, Urine: 9 mg/dL

## 2017-10-08 LAB — URINALYSIS, ROUTINE W REFLEX MICROSCOPIC
Bilirubin Urine: NEGATIVE
Glucose, UA: NEGATIVE mg/dL
Hgb urine dipstick: NEGATIVE
KETONES UR: NEGATIVE mg/dL
Nitrite: NEGATIVE
Protein, ur: NEGATIVE mg/dL
Specific Gravity, Urine: 1.015 (ref 1.005–1.030)
pH: 5 (ref 5.0–8.0)

## 2017-10-08 MED ORDER — BETAMETHASONE SOD PHOS & ACET 6 (3-3) MG/ML IJ SUSP
12.0000 mg | Freq: Once | INTRAMUSCULAR | Status: AC
  Administered 2017-10-08: 12 mg via INTRAMUSCULAR
  Filled 2017-10-08: qty 2

## 2017-10-08 NOTE — MAU Provider Note (Signed)
History     CSN: [id number]  Arrival date and time: 10/08/17 1346   First Provider Initiated Contact with Patient 10/08/17 1451      Chief Complaint  Patient presents with  . Injections  . Diarrhea   42 y.o. G2P1001 @32 .2 wks here for second BMZ shot and found to have elevated BP. Denies visual disturbances and epigastric pain but reports daily migraine HAs managed by [facility]. Good FM. Denies VB or LOF. Having irregular ctx. Took Procardia around 1130 today. Hx of PEC in previous pregnancy. Had diarrhea around noon today. No sick contacts or fevers.   OB History    Gravida  2   Para  1   Term  1   Preterm      AB      Living  1     SAB      TAB      Ectopic      Multiple      Live Births              Past Medical History:  Diagnosis Date  . Bronchitis   . Kidney stones   . Migraine   . Pneumonia     Past Surgical History:  Procedure Laterality Date  . KIDNEY STONE SURGERY    . WISDOM TOOTH EXTRACTION      Family History  Problem Relation Age of Onset  . Heart disease Father   . Asthma Sister   . Cancer Maternal Grandmother   . Cancer Maternal Grandfather   . Cancer Paternal Grandmother   . Diabetes Paternal Grandmother   . Stroke Paternal Grandfather     Social History   Tobacco Use  . Smoking status: Never Smoker  . Smokeless tobacco: Never Used  Substance Use Topics  . Alcohol use: Yes  . Drug use: No    Allergies:  Allergies  Allergen Reactions  . Latex   . Morphine And Related Nausea And Vomiting  . Pineapple   . Rhinocort [Budesonide]   . Symbicort [Budesonide-Formoterol Fumarate] Swelling    Medications Prior to Admission  Medication Sig Dispense Refill Last Dose  . Acetaminophen (TYLENOL PO) Take by mouth.   Taking  . ASPIRIN 81 PO Take by mouth.   Taking  . Butalbital-APAP-Caffeine (FIORICET PO) Take by mouth.   Taking  . diphenhydrAMINE HCl (BENADRYL ALLERGY PO) Take by mouth.   Taking  . Melatonin 3 MG CAPS  Take by mouth.   Taking  . NIFEdipine (PROCARDIA) 10 MG capsule Take 1 capsule (10 mg total) by mouth every 6 (six) hours. 60 capsule 1   . Omega-3 Fatty Acids (FISH OIL PO) Take by mouth.   Taking  . Prenatal Vit-Fe Fumarate-FA (PRENATAL VITAMIN PO) Take by mouth.   Taking  . tobramycin-dexamethasone (TOBRADEX) ophthalmic ointment Place 1 application into the left eye 3 (three) times daily. Place onto lesion adjacent your left eye 3 times a day (Patient not taking: Reported on 07/06/2017) 3.5 g 0 Not Taking  . valACYclovir HCl (VALTREX PO) Take by mouth.   Taking    Review of Systems  Eyes: Negative for visual disturbance.  Gastrointestinal: Positive for diarrhea. Negative for abdominal pain, nausea and vomiting.  Genitourinary: Negative for vaginal bleeding and vaginal discharge.  Neurological: Positive for headaches.   Physical Exam   Blood pressure 134/80, pulse 84, temperature 98.1 F (36.7 C), temperature source Oral, resp. rate 16, SpO2 99 %.  Patient Vitals for the past 24 hrs:  BP Temp Temp src Pulse Resp SpO2  10/08/17 1545 134/80 - - 84 - -  10/08/17 1530 126/73 - - 75 - -  10/08/17 1516 124/76 - - 76 - -  10/08/17 1500 131/79 - - 84 - -  10/08/17 1445 136/81 - - 81 - -  10/08/17 1438 138/81 - - 80 - -  10/08/17 1425 (!) 155/95 98.1 F (36.7 C) Oral 83 16 99 %  10/08/17 1420 (!) 144/89 - - - - -    Physical Exam  Constitutional: She is oriented to person, place, and time. She appears well-developed and well-nourished. No distress.  HENT:  Head: Normocephalic and atraumatic.  Neck: Normal range of motion.  Cardiovascular: Normal rate.  Respiratory: Effort normal.  GI: Soft. She exhibits no distension. There is no tenderness.  Genitourinary:  Genitourinary Comments: VE: 1/60/-2  Musculoskeletal: Normal range of motion.  Neurological: She is alert and oriented to person, place, and time.  Skin: Skin is warm and dry.  Psychiatric: She has a normal mood and affect.    Results for orders placed or performed during the hospital encounter of 10/08/17 (from the past 24 hour(s))  Protein / creatinine ratio, urine     Status: None   Collection Time: 10/08/17  2:43 PM  Result Value Ref Range   Creatinine, Urine 107.00 mg/dL   Total Protein, Urine 9 mg/dL   Protein Creatinine Ratio 0.08 0.00 - 0.15 mg/mg[Cre]  CBC     Status: Abnormal   Collection Time: 10/08/17  2:54 PM  Result Value Ref Range   WBC 10.1 4.0 - 10.5 K/uL   RBC 3.57 (L) 3.87 - 5.11 MIL/uL   Hemoglobin 12.1 12.0 - 15.0 g/dL   HCT 16.1 (L) 09.6 - 04.5 %   MCV 95.0 78.0 - 100.0 fL   MCH 33.9 26.0 - 34.0 pg   MCHC 35.7 30.0 - 36.0 g/dL   RDW 40.9 81.1 - 91.4 %   Platelets 151 150 - 400 K/uL  Comprehensive metabolic panel     Status: Abnormal   Collection Time: 10/08/17  2:54 PM  Result Value Ref Range   Sodium 134 (L) 135 - 145 mmol/L   Potassium 3.2 (L) 3.5 - 5.1 mmol/L   Chloride 104 98 - 111 mmol/L   CO2 19 (L) 22 - 32 mmol/L   Glucose, Bld 134 (H) 70 - 99 mg/dL   BUN 12 6 - 20 mg/dL   Creatinine, Ser 7.82 0.44 - 1.00 mg/dL   Calcium 8.6 (L) 8.9 - 10.3 mg/dL   Total Protein 6.2 (L) 6.5 - 8.1 g/dL   Albumin 3.2 (L) 3.5 - 5.0 g/dL   AST 23 15 - 41 U/L   ALT 20 0 - 44 U/L   Alkaline Phosphatase 83 38 - 126 U/L   Total Bilirubin 0.2 (L) 0.3 - 1.2 mg/dL   GFR calc non Af Amer >60 >60 mL/min   GFR calc Af Amer >60 >60 mL/min   Anion gap 11 5 - 15  Urinalysis, Routine w reflex microscopic     Status: Abnormal   Collection Time: 10/08/17  3:11 PM  Result Value Ref Range   Color, Urine YELLOW YELLOW   APPearance CLEAR CLEAR   Specific Gravity, Urine 1.015 1.005 - 1.030   pH 5.0 5.0 - 8.0   Glucose, UA NEGATIVE NEGATIVE mg/dL   Hgb urine dipstick NEGATIVE NEGATIVE   Bilirubin Urine NEGATIVE NEGATIVE   Ketones, ur NEGATIVE NEGATIVE mg/dL   Protein,  ur NEGATIVE NEGATIVE mg/dL   Nitrite NEGATIVE NEGATIVE   Leukocytes, UA TRACE (A) NEGATIVE   RBC / HPF 0-5 0 - 5 RBC/hpf   WBC, UA  6-10 0 - 5 WBC/hpf   Bacteria, UA FEW (A) NONE SEEN   Squamous Epithelial / LPF 6-10 0 - 5   Mucus PRESENT    MAU Course  Procedures BMZ  MDM Labs ordered and reviewed. Mild hypokalemia noted. No evidence of PEC or PTL. BPs improved and nml. Presentation, clinical findings, and plan discussed with Dr. Billy Coastaavon. Stable for discharge home.  Assessment and Plan   1. [redacted] weeks gestation of pregnancy   2. NST (non-stress test) reactive   3. Hypertension affecting pregnancy in third trimester    Discharge home Follow up in OB office in 2 days PTL precautions PEC precautions Continue Procardia  Allergies as of 10/08/2017      Reactions   Latex    Morphine And Related Nausea And Vomiting   Pineapple    Rhinocort [budesonide]    Symbicort [budesonide-formoterol Fumarate] Swelling      Medication List    STOP taking these medications   Melatonin 3 MG Caps   tobramycin-dexamethasone ophthalmic ointment Commonly known as:  TOBRADEX     TAKE these medications   ASPIRIN 81 PO Take by mouth.   BENADRYL ALLERGY PO Take by mouth.   FIORICET PO Take by mouth.   FISH OIL PO Take by mouth.   NIFEdipine 10 MG capsule Commonly known as:  PROCARDIA Take 1 capsule (10 mg total) by mouth every 6 (six) hours.   PRENATAL VITAMIN PO Take by mouth.   TYLENOL PO Take by mouth.   VALTREX PO Take by mouth.      Donette LarryMelanie Christie Copley, CNM 10/08/2017, 4:12 PM

## 2017-10-08 NOTE — Discharge Instructions (Signed)

## 2017-10-08 NOTE — MAU Note (Signed)
Amy Yang is a 42 y.o. at 1753w2d here in MAU: For 2nd BMZ +diarrhea since 1.5 hours ago States last time it was like this she went into labor Patient states she just does not feel well  Pain score: denies Vitals:   10/08/17 1425  BP: (!) 155/95  Pulse: 83  Resp: 16  Temp: 98.1 F (36.7 C)  SpO2: 99%     FHT: Lab orders placed from triage: ua Discussed with Fabian NovemberM. Bhambri CNM

## 2017-10-10 LAB — GC/CHLAMYDIA PROBE AMP (~~LOC~~) NOT AT ARMC
CHLAMYDIA, DNA PROBE: NEGATIVE
NEISSERIA GONORRHEA: NEGATIVE

## 2017-11-14 ENCOUNTER — Other Ambulatory Visit: Payer: Self-pay

## 2017-11-14 ENCOUNTER — Inpatient Hospital Stay (HOSPITAL_COMMUNITY)
Admission: AD | Admit: 2017-11-14 | Discharge: 2017-11-19 | DRG: 806 | Disposition: A | Discharge status: Home / Self Care | Payer: 59 | Attending: Obstetrics and Gynecology | Admitting: Obstetrics and Gynecology

## 2017-11-14 ENCOUNTER — Encounter (HOSPITAL_COMMUNITY): Payer: Self-pay | Admitting: *Deleted

## 2017-11-14 DIAGNOSIS — O9081 Anemia of the puerperium: Secondary | ICD-10-CM | POA: Diagnosis not present

## 2017-11-14 DIAGNOSIS — G5603 Carpal tunnel syndrome, bilateral upper limbs: Secondary | ICD-10-CM | POA: Diagnosis present

## 2017-11-14 DIAGNOSIS — O26893 Other specified pregnancy related conditions, third trimester: Secondary | ICD-10-CM | POA: Diagnosis present

## 2017-11-14 DIAGNOSIS — Z3A37 37 weeks gestation of pregnancy: Secondary | ICD-10-CM | POA: Diagnosis not present

## 2017-11-14 DIAGNOSIS — O1490 Unspecified pre-eclampsia, unspecified trimester: Secondary | ICD-10-CM | POA: Diagnosis present

## 2017-11-14 DIAGNOSIS — O134 Gestational [pregnancy-induced] hypertension without significant proteinuria, complicating childbirth: Secondary | ICD-10-CM | POA: Diagnosis present

## 2017-11-14 DIAGNOSIS — O1494 Unspecified pre-eclampsia, complicating childbirth: Secondary | ICD-10-CM | POA: Diagnosis present

## 2017-11-14 DIAGNOSIS — D62 Acute posthemorrhagic anemia: Secondary | ICD-10-CM | POA: Diagnosis not present

## 2017-11-14 HISTORY — DX: Encounter for procreative management, unspecified: Z31.9

## 2017-11-14 LAB — COMPREHENSIVE METABOLIC PANEL
ALT: 26 U/L (ref 0–44)
AST: 37 U/L (ref 15–41)
Albumin: 3.3 g/dL — ABNORMAL LOW (ref 3.5–5.0)
Alkaline Phosphatase: 141 U/L — ABNORMAL HIGH (ref 38–126)
Anion gap: 11 (ref 5–15)
BUN: 15 mg/dL (ref 6–20)
CO2: 20 mmol/L — ABNORMAL LOW (ref 22–32)
Calcium: 9 mg/dL (ref 8.9–10.3)
Chloride: 104 mmol/L (ref 98–111)
Creatinine, Ser: 0.88 mg/dL (ref 0.44–1.00)
Glucose, Bld: 92 mg/dL (ref 70–99)
Potassium: 4.2 mmol/L (ref 3.5–5.1)
Sodium: 135 mmol/L (ref 135–145)
TOTAL PROTEIN: 5.9 g/dL — AB (ref 6.5–8.1)
Total Bilirubin: 0.4 mg/dL (ref 0.3–1.2)

## 2017-11-14 LAB — OB RESULTS CONSOLE RUBELLA ANTIBODY, IGM: RUBELLA: IMMUNE

## 2017-11-14 LAB — OB RESULTS CONSOLE ANTIBODY SCREEN: Antibody Screen: NEGATIVE

## 2017-11-14 LAB — OB RESULTS CONSOLE ABO/RH: RH TYPE: POSITIVE

## 2017-11-14 LAB — CBC
HCT: 36.6 % (ref 36.0–46.0)
HEMATOCRIT: 35.9 % — AB (ref 36.0–46.0)
HEMOGLOBIN: 12.6 g/dL (ref 12.0–15.0)
Hemoglobin: 12.5 g/dL (ref 12.0–15.0)
MCH: 33.2 pg (ref 26.0–34.0)
MCH: 33.2 pg (ref 26.0–34.0)
MCHC: 34.8 g/dL (ref 30.0–36.0)
MCHC: 34.4 g/dL (ref 30.0–36.0)
MCV: 95.5 fL (ref 78.0–100.0)
MCV: 96.6 fL (ref 78.0–100.0)
Platelets: 136 10*3/uL — ABNORMAL LOW (ref 150–400)
Platelets: 135 10*3/uL — ABNORMAL LOW (ref 150–400)
RBC: 3.76 MIL/uL — ABNORMAL LOW (ref 3.87–5.11)
RBC: 3.79 MIL/uL — ABNORMAL LOW (ref 3.87–5.11)
RDW: 12.6 % (ref 11.5–15.5)
RDW: 12.7 % (ref 11.5–15.5)
WBC: 8.5 10*3/uL (ref 4.0–10.5)
WBC: 9.4 10*3/uL (ref 4.0–10.5)

## 2017-11-14 LAB — OB RESULTS CONSOLE GBS: GBS: NEGATIVE

## 2017-11-14 LAB — TYPE AND SCREEN
ABO/RH(D): A POS
ANTIBODY SCREEN: NEGATIVE

## 2017-11-14 LAB — OB RESULTS CONSOLE GC/CHLAMYDIA
Chlamydia: NEGATIVE
GC PROBE AMP, GENITAL: NEGATIVE

## 2017-11-14 LAB — OB RESULTS CONSOLE HEPATITIS B SURFACE ANTIGEN: Hepatitis B Surface Ag: NEGATIVE

## 2017-11-14 LAB — ABO/RH: ABO/RH(D): A POS

## 2017-11-14 LAB — OB RESULTS CONSOLE HIV ANTIBODY (ROUTINE TESTING): HIV: NONREACTIVE

## 2017-11-14 LAB — OB RESULTS CONSOLE RPR: RPR: NONREACTIVE

## 2017-11-14 MED ORDER — ONDANSETRON HCL 4 MG/2ML IJ SOLN: 4.0000 mg | Freq: Four times a day (QID) | INTRAMUSCULAR | Status: DC | PRN

## 2017-11-14 MED ORDER — OXYTOCIN BOLUS FROM INFUSION
500.0000 mL | Freq: Once | INTRAVENOUS | Status: AC
  Administered 2017-11-15: 500 mL via INTRAVENOUS

## 2017-11-14 MED ORDER — SOD CITRATE-CITRIC ACID 500-334 MG/5ML PO SOLN: 30.0000 mL | ORAL | Status: DC | PRN

## 2017-11-14 MED ORDER — LACTATED RINGERS IV SOLN
INTRAVENOUS | Status: DC
  Administered 2017-11-14 – 2017-11-15 (×4): via INTRAVENOUS

## 2017-11-14 MED ORDER — LABETALOL HCL 5 MG/ML IV SOLN
80.0000 mg | INTRAVENOUS | Status: DC | PRN
  Administered 2017-11-17: 80 mg via INTRAVENOUS
  Filled 2017-11-14 (×3): qty 16

## 2017-11-14 MED ORDER — MAGNESIUM SULFATE 40 G IN LACTATED RINGERS - SIMPLE
2.0000 g/h | INTRAVENOUS | Status: DC
  Filled 2017-11-14: qty 500

## 2017-11-14 MED ORDER — HYDRALAZINE HCL 20 MG/ML IJ SOLN
10.0000 mg | INTRAMUSCULAR | Status: DC | PRN
  Administered 2017-11-17: 10 mg via INTRAVENOUS
  Filled 2017-11-14 (×2): qty 1

## 2017-11-14 MED ORDER — LACTATED RINGERS IV SOLN
500.0000 mL | Freq: Once | INTRAVENOUS | Status: AC
  Administered 2017-11-15: 1000 mL via INTRAVENOUS

## 2017-11-14 MED ORDER — OXYTOCIN 40 UNITS IN LACTATED RINGERS INFUSION - SIMPLE MED
2.5000 [IU]/h | INTRAVENOUS | Status: DC
  Filled 2017-11-14: qty 1000

## 2017-11-14 MED ORDER — DIPHENHYDRAMINE HCL 50 MG/ML IJ SOLN: 12.5000 mg | INTRAMUSCULAR | Status: DC | PRN

## 2017-11-14 MED ORDER — TERBUTALINE SULFATE 1 MG/ML IJ SOLN: 0.2500 mg | Freq: Once | INTRAMUSCULAR | Status: DC | PRN

## 2017-11-14 MED ORDER — MAGNESIUM SULFATE BOLUS VIA INFUSION
4.0000 g | Freq: Once | INTRAVENOUS | Status: AC
  Administered 2017-11-14: 4 g via INTRAVENOUS
  Filled 2017-11-14: qty 500

## 2017-11-14 MED ORDER — PHENYLEPHRINE 40 MCG/ML (10ML) SYRINGE FOR IV PUSH (FOR BLOOD PRESSURE SUPPORT)
80.0000 ug | PREFILLED_SYRINGE | INTRAVENOUS | Status: DC | PRN
  Filled 2017-11-14: qty 5
  Filled 2017-11-14: qty 10

## 2017-11-14 MED ORDER — MAGNESIUM SULFATE 40 G IN LACTATED RINGERS - SIMPLE
2.0000 g/h | INTRAVENOUS | Status: DC
  Administered 2017-11-14 – 2017-11-15 (×2): 2 g/h via INTRAVENOUS
  Filled 2017-11-14 (×2): qty 500

## 2017-11-14 MED ORDER — OXYCODONE-ACETAMINOPHEN 5-325 MG PO TABS
1.0000 | ORAL_TABLET | ORAL | Status: DC | PRN
  Administered 2017-11-15: 1 via ORAL
  Filled 2017-11-14: qty 1

## 2017-11-14 MED ORDER — OXYCODONE-ACETAMINOPHEN 5-325 MG PO TABS
2.0000 | ORAL_TABLET | ORAL | Status: DC | PRN
  Filled 2017-11-14: qty 2

## 2017-11-14 MED ORDER — LACTATED RINGERS IV SOLN: 500.0000 mL | INTRAVENOUS | Status: DC | PRN

## 2017-11-14 MED ORDER — OXYTOCIN 40 UNITS IN LACTATED RINGERS INFUSION - SIMPLE MED
1.0000 m[IU]/min | INTRAVENOUS | Status: DC
  Administered 2017-11-14: 2 m[IU]/min via INTRAVENOUS
  Filled 2017-11-14: qty 1000

## 2017-11-14 MED ORDER — LIDOCAINE HCL (PF) 1 % IJ SOLN
30.0000 mL | INTRAMUSCULAR | Status: DC | PRN
  Administered 2017-11-15: 30 mL via SUBCUTANEOUS
  Filled 2017-11-14: qty 30

## 2017-11-14 MED ORDER — LABETALOL HCL 5 MG/ML IV SOLN
20.0000 mg | INTRAVENOUS | Status: DC | PRN
  Administered 2017-11-15 – 2017-11-17 (×3): 20 mg via INTRAVENOUS
  Filled 2017-11-14: qty 4

## 2017-11-14 MED ORDER — FENTANYL 2.5 MCG/ML BUPIVACAINE 1/10 % EPIDURAL INFUSION (WH - ANES)
14.0000 mL/h | INTRAMUSCULAR | Status: DC | PRN
  Administered 2017-11-15 (×2): 14 mL/h via EPIDURAL
  Filled 2017-11-14 (×2): qty 100

## 2017-11-14 MED ORDER — EPHEDRINE 5 MG/ML INJ
10.0000 mg | INTRAVENOUS | Status: DC | PRN
  Filled 2017-11-14: qty 2

## 2017-11-14 MED ORDER — LABETALOL HCL 5 MG/ML IV SOLN
40.0000 mg | INTRAVENOUS | Status: DC | PRN
  Administered 2017-11-15 – 2017-11-17 (×2): 40 mg via INTRAVENOUS
  Filled 2017-11-14: qty 8

## 2017-11-14 MED ORDER — PHENYLEPHRINE 40 MCG/ML (10ML) SYRINGE FOR IV PUSH (FOR BLOOD PRESSURE SUPPORT)
80.0000 ug | PREFILLED_SYRINGE | INTRAVENOUS | Status: DC | PRN
  Filled 2017-11-14: qty 5

## 2017-11-14 MED ORDER — ACETAMINOPHEN 325 MG PO TABS
650.0000 mg | ORAL_TABLET | ORAL | Status: DC | PRN
  Administered 2017-11-15: 650 mg via ORAL
  Filled 2017-11-14: qty 2

## 2017-11-14 MED ORDER — FLEET ENEMA 7-19 GM/118ML RE ENEM: 1.0000 | ENEMA | RECTAL | Status: DC | PRN

## 2017-11-14 NOTE — H&P (Signed)
Amy Yang is a 42 y.o. female presenting for IOL for gestational htn , ? PEC w/o severe features. OB History    Gravida  2   Para  1   Term  1   Preterm      AB      Living  1     SAB      TAB      Ectopic      Multiple      Live Births             Past Medical History:  Diagnosis Date  . Bronchitis   . Encounter for infertility   . Kidney stones   . Kidney stones   . Migraine   . Pneumonia   . Preterm labor    Past Surgical History:  Procedure Laterality Date  . KIDNEY STONE SURGERY    . WISDOM TOOTH EXTRACTION     Family History: family history includes Asthma in her sister; Cancer in her maternal grandfather, maternal grandmother, and paternal grandmother; Diabetes in her paternal grandmother; Heart disease in her father; Stroke in her paternal grandfather. Social History:  reports that she has never smoked. She has never used smokeless tobacco. She reports that she drinks alcohol. She reports that she does not use drugs.     Maternal Diabetes: No Genetic Screening: Normal Maternal Ultrasounds/Referrals: Normal Fetal Ultrasounds or other Referrals:  None Maternal Substance Abuse:  No Significant Maternal Medications:  None Significant Maternal Lab Results:  None Other Comments:  None  Review of Systems  Constitutional: Negative.   All other systems reviewed and are negative.  Maternal Medical History:  Contractions: Onset was less than 1 hour ago.   Frequency: rare.    Fetal activity: Perceived fetal activity is normal.   Last perceived fetal movement was within the past hour.    Prenatal complications: PIH.   Prenatal Complications - Diabetes: none.      Blood pressure (!) 162/94, pulse 78. Maternal Exam:  Uterine Assessment: Contraction strength is mild.  Abdomen: Patient reports no abdominal tenderness. Fetal presentation: vertex  Introitus: Normal vulva. Normal vagina.  Ferning test: not done.  Nitrazine test: not  done. Amniotic fluid character: not assessed.  Pelvis: adequate for delivery.   Cervix: Cervix evaluated by digital exam.     Physical Exam  Nursing note and vitals reviewed. Constitutional: She is oriented to person, place, and time. She appears well-developed and well-nourished.  HENT:  Head: Normocephalic and atraumatic.  Neck: Normal range of motion. Neck supple.  Cardiovascular: Normal rate and regular rhythm.  Respiratory: Effort normal and breath sounds normal.  GI: Soft. Bowel sounds are normal.  Genitourinary: Vagina normal and uterus normal.  Musculoskeletal: Normal range of motion.  Neurological: She is alert and oriented to person, place, and time. She has normal reflexes.  Skin: Skin is warm and dry.  Psychiatric: She has a normal mood and affect.    Prenatal labs: ABO, Rh: A/Positive/-- (09/09 1617) Antibody: Negative (09/09 1617) Rubella: Immune (09/09 1617) RPR: Nonreactive (09/09 1617)  HBsAg: Negative (09/09 1617)  HIV: Non-reactive (09/09 1617)  GBS:     Assessment/Plan: AMA PEC vs Gestational HTN Ck labs Pt declines MagSO4 - related to pt will ck serial BPs and labs. Will discuss Mag prophylaxis intrapartum pending BPs and labs.   Amy Yang J 11/14/2017, 5:21 PM

## 2017-11-14 NOTE — Progress Notes (Signed)
Amy Yang is a 42 y.o. G2P1001 at [redacted]w[redacted]d by LMP admitted for induction of labor due to Pre-eclamptic toxemia of pregnancy..  Subjective: NO headaches or visual changes No epigastric pain  Objective: BP (!) 144/89   Pulse 70   Temp 98.2 F (36.8 C) (Oral)   Resp 15   Ht 5\' 7"  (1.702 m)   Wt 82.1 kg   BMI 28.35 kg/m  I/O last 3 completed shifts: In: 144.9 [I.V.:144.9] Out: -  Total I/O In: 397.9 [P.O.:200; I.V.:197.9] Out: 300 [Urine:300]  FHT:  FHR: 145 bpm, variability: moderate,  accelerations:  Present,  decelerations:  Absent UC:   irregular, every 8 minutes SVE:   Dilation: 2.5 Effacement (%): 50 Station: -2 Exam by:: J.Cox, RN  Labs: Lab Results  Component Value Date   WBC 8.5 11/14/2017   HGB 12.5 11/14/2017   HCT 35.9 (L) 11/14/2017   MCV 95.5 11/14/2017   PLT 136 (L) 11/14/2017    Assessment / Plan: Induction of labor due to preeclampsia,  progressing well on pitocin  Labor: Progressing normally and Progressing on Pitocin, will continue to increase then AROM Preeclampsia:  on magnesium sulfate, no signs or symptoms of toxicity, intake and ouput balanced and labs stable Fetal Wellbeing:  Category I Pain Control:  Labor support without medications I/D:  n/a Anticipated MOD:  NSVD  Audreana Hancox J 11/14/2017, 9:35 PM

## 2017-11-15 ENCOUNTER — Encounter (HOSPITAL_COMMUNITY): Payer: Self-pay | Admitting: *Deleted

## 2017-11-15 ENCOUNTER — Inpatient Hospital Stay (HOSPITAL_COMMUNITY): Payer: 59 | Admitting: Anesthesiology

## 2017-11-15 LAB — CBC
HCT: 32.3 % — ABNORMAL LOW (ref 36.0–46.0)
HEMATOCRIT: 34 % — AB (ref 36.0–46.0)
HEMOGLOBIN: 11.8 g/dL — AB (ref 12.0–15.0)
HEMOGLOBIN: 11.2 g/dL — AB (ref 12.0–15.0)
MCH: 33.6 pg (ref 26.0–34.0)
MCH: 33.8 pg (ref 26.0–34.0)
MCHC: 34.7 g/dL (ref 30.0–36.0)
MCHC: 34.7 g/dL (ref 30.0–36.0)
MCV: 96.9 fL (ref 78.0–100.0)
MCV: 97.6 fL (ref 78.0–100.0)
Platelets: 121 10*3/uL — ABNORMAL LOW (ref 150–400)
Platelets: 139 10*3/uL — ABNORMAL LOW (ref 150–400)
RBC: 3.51 MIL/uL — AB (ref 3.87–5.11)
RBC: 3.31 MIL/uL — AB (ref 3.87–5.11)
RDW: 12.7 % (ref 11.5–15.5)
RDW: 12.8 % (ref 11.5–15.5)
WBC: 10.2 10*3/uL (ref 4.0–10.5)
WBC: 15.5 10*3/uL — ABNORMAL HIGH (ref 4.0–10.5)

## 2017-11-15 LAB — COMPREHENSIVE METABOLIC PANEL
ALT: 24 U/L (ref 0–44)
ANION GAP: 11 (ref 5–15)
AST: 31 U/L (ref 15–41)
Albumin: 2.9 g/dL — ABNORMAL LOW (ref 3.5–5.0)
Alkaline Phosphatase: 130 U/L — ABNORMAL HIGH (ref 38–126)
BILIRUBIN TOTAL: 0.6 mg/dL (ref 0.3–1.2)
BUN: 9 mg/dL (ref 6–20)
CALCIUM: 7.5 mg/dL — AB (ref 8.9–10.3)
CO2: 19 mmol/L — AB (ref 22–32)
CREATININE: 0.73 mg/dL (ref 0.44–1.00)
Chloride: 102 mmol/L (ref 98–111)
Glucose, Bld: 113 mg/dL — ABNORMAL HIGH (ref 70–99)
Potassium: 3.9 mmol/L (ref 3.5–5.1)
SODIUM: 132 mmol/L — AB (ref 135–145)
TOTAL PROTEIN: 6 g/dL — AB (ref 6.5–8.1)

## 2017-11-15 LAB — RPR: RPR Ser Ql: NONREACTIVE

## 2017-11-15 MED ORDER — BENZOCAINE-MENTHOL 20-0.5 % EX AERO
1.0000 "application " | INHALATION_SPRAY | CUTANEOUS | Status: DC | PRN
  Administered 2017-11-16 – 2017-11-17 (×2): 1 via TOPICAL
  Filled 2017-11-15 (×2): qty 56

## 2017-11-15 MED ORDER — OXYCODONE-ACETAMINOPHEN 5-325 MG PO TABS
1.0000 | ORAL_TABLET | ORAL | Status: DC | PRN
  Administered 2017-11-17 (×3): 1 via ORAL
  Filled 2017-11-15 (×2): qty 1

## 2017-11-15 MED ORDER — ONDANSETRON HCL 4 MG/2ML IJ SOLN: 4.0000 mg | INTRAMUSCULAR | Status: DC | PRN

## 2017-11-15 MED ORDER — PROMETHAZINE HCL 25 MG/ML IJ SOLN: 12.5000 mg | Freq: Four times a day (QID) | INTRAMUSCULAR | Status: DC | PRN

## 2017-11-15 MED ORDER — BUTORPHANOL TARTRATE 1 MG/ML IJ SOLN
1.0000 mg | Freq: Once | INTRAMUSCULAR | Status: AC
  Administered 2017-11-15: 1 mg via INTRAVENOUS
  Filled 2017-11-15: qty 1

## 2017-11-15 MED ORDER — ZOLPIDEM TARTRATE 5 MG PO TABS: 5.0000 mg | ORAL_TABLET | Freq: Every evening | ORAL | Status: DC | PRN

## 2017-11-15 MED ORDER — PRENATAL MULTIVITAMIN CH
1.0000 | ORAL_TABLET | Freq: Every day | ORAL | Status: DC
  Administered 2017-11-16 – 2017-11-19 (×4): 1 via ORAL
  Filled 2017-11-15 (×4): qty 1

## 2017-11-15 MED ORDER — SENNOSIDES-DOCUSATE SODIUM 8.6-50 MG PO TABS
2.0000 | ORAL_TABLET | ORAL | Status: DC
  Administered 2017-11-15 – 2017-11-16 (×2): 2 via ORAL
  Filled 2017-11-15 (×3): qty 2

## 2017-11-15 MED ORDER — LABETALOL HCL 100 MG PO TABS
100.0000 mg | ORAL_TABLET | Freq: Three times a day (TID) | ORAL | Status: DC
  Administered 2017-11-16 – 2017-11-17 (×5): 100 mg via ORAL
  Filled 2017-11-15 (×5): qty 1

## 2017-11-15 MED ORDER — TETANUS-DIPHTH-ACELL PERTUSSIS 5-2.5-18.5 LF-MCG/0.5 IM SUSP: 0.5000 mL | Freq: Once | INTRAMUSCULAR | Status: DC

## 2017-11-15 MED ORDER — DIPHENHYDRAMINE HCL 25 MG PO CAPS: 25.0000 mg | ORAL_CAPSULE | Freq: Four times a day (QID) | ORAL | Status: DC | PRN

## 2017-11-15 MED ORDER — COCONUT OIL OIL: 1.0000 "application " | TOPICAL_OIL | Status: DC | PRN

## 2017-11-15 MED ORDER — LIDOCAINE HCL (PF) 1 % IJ SOLN
INTRAMUSCULAR | Status: DC | PRN
  Administered 2017-11-15: 8 mL via EPIDURAL
  Administered 2017-11-15: 5 mL via EPIDURAL

## 2017-11-15 MED ORDER — OXYCODONE-ACETAMINOPHEN 5-325 MG PO TABS
2.0000 | ORAL_TABLET | ORAL | Status: DC | PRN
  Administered 2017-11-15 – 2017-11-19 (×11): 2 via ORAL
  Filled 2017-11-15 (×11): qty 2

## 2017-11-15 MED ORDER — DIBUCAINE 1 % RE OINT
1.0000 "application " | TOPICAL_OINTMENT | RECTAL | Status: DC | PRN
  Administered 2017-11-17: 1 via RECTAL
  Filled 2017-11-15: qty 28

## 2017-11-15 MED ORDER — LABETALOL HCL 100 MG PO TABS
100.0000 mg | ORAL_TABLET | Freq: Once | ORAL | Status: AC
  Administered 2017-11-15: 100 mg via ORAL
  Filled 2017-11-15: qty 1

## 2017-11-15 MED ORDER — LABETALOL HCL 100 MG PO TABS: 100.0000 mg | ORAL_TABLET | Freq: Two times a day (BID) | ORAL | Status: DC

## 2017-11-15 MED ORDER — ACETAMINOPHEN 325 MG PO TABS
650.0000 mg | ORAL_TABLET | ORAL | Status: DC | PRN
  Filled 2017-11-15: qty 2

## 2017-11-15 MED ORDER — SODIUM BICARBONATE 8.4 % IV SOLN
INTRAVENOUS | Status: DC | PRN
  Administered 2017-11-15: 7 mL via EPIDURAL

## 2017-11-15 MED ORDER — BUPIVACAINE HCL (PF) 0.25 % IJ SOLN
INTRAMUSCULAR | Status: DC | PRN
  Administered 2017-11-15: 10 mL via EPIDURAL

## 2017-11-15 MED ORDER — WITCH HAZEL-GLYCERIN EX PADS
1.0000 "application " | MEDICATED_PAD | CUTANEOUS | Status: DC | PRN
  Administered 2017-11-17: 1 via TOPICAL

## 2017-11-15 MED ORDER — SIMETHICONE 80 MG PO CHEW: 80.0000 mg | CHEWABLE_TABLET | ORAL | Status: DC | PRN

## 2017-11-15 MED ORDER — PROMETHAZINE HCL 25 MG/ML IJ SOLN
12.5000 mg | Freq: Once | INTRAMUSCULAR | Status: AC
  Administered 2017-11-15: 12.5 mg via INTRAVENOUS
  Filled 2017-11-15: qty 1

## 2017-11-15 MED ORDER — IBUPROFEN 600 MG PO TABS
600.0000 mg | ORAL_TABLET | Freq: Four times a day (QID) | ORAL | Status: DC
  Administered 2017-11-15 – 2017-11-19 (×15): 600 mg via ORAL
  Filled 2017-11-15 (×15): qty 1

## 2017-11-15 MED ORDER — ONDANSETRON HCL 4 MG PO TABS: 4.0000 mg | ORAL_TABLET | ORAL | Status: DC | PRN

## 2017-11-15 NOTE — Anesthesia Preprocedure Evaluation (Signed)
Anesthesia Evaluation  Patient identified by MRN, date of birth, ID band Patient awake    Reviewed: Allergy & Precautions, H&P , NPO status , Patient's Chart, lab work & pertinent test results  Airway Mallampati: I  TM Distance: >3 FB Neck ROM: full    Dental no notable dental hx. (+) Teeth Intact   Pulmonary neg pulmonary ROS,    Pulmonary exam normal breath sounds clear to auscultation       Cardiovascular negative cardio ROS Normal cardiovascular exam Rhythm:regular Rate:Normal     Neuro/Psych negative psych ROS   GI/Hepatic negative GI ROS, Neg liver ROS,   Endo/Other  negative endocrine ROS  Renal/GU      Musculoskeletal   Abdominal Normal abdominal exam  (+)   Peds  Hematology  (+) anemia ,   Anesthesia Other Findings   Reproductive/Obstetrics (+) Pregnancy                             Anesthesia Physical Anesthesia Plan  ASA: II  Anesthesia Plan: Epidural   Post-op Pain Management:    Induction:   PONV Risk Score and Plan:   Airway Management Planned:   Additional Equipment:   Intra-op Plan:   Post-operative Plan:   Informed Consent: I have reviewed the patients History and Physical, chart, labs and discussed the procedure including the risks, benefits and alternatives for the proposed anesthesia with the patient or authorized representative who has indicated his/her understanding and acceptance.     Plan Discussed with:   Anesthesia Plan Comments:         Anesthesia Quick Evaluation

## 2017-11-15 NOTE — Progress Notes (Signed)
Amy Yang is a 42 y.o. G2P1001 at [redacted]w[redacted]d by LMP admitted for induction of labor due to Pre-eclamptic toxemia of pregnancy..  Subjective: Feeling contractions No headache, visual changes or epigastric pain  Objective: BP (!) 142/76   Pulse 73   Temp 98.4 F (36.9 C) (Axillary) Comment (Src): pt drinking iced Coke  Resp 20   Ht 5\' 7"  (1.702 m)   Wt 82.1 kg   SpO2 99%   BMI 28.35 kg/m  I/O last 3 completed shifts: In: 919.8 [P.O.:200; I.V.:719.8] Out: 1450 [Urine:1450] Total I/O In: 2510.6 [P.O.:600; I.V.:1910.6] Out: 1000 [Urine:1000]  FHT:  FHR: 120s bpm, variability: moderate,  accelerations:  Present,  decelerations:  Absent UC:   irregular, every 3-5 minutes SVE:   4-5/100/0 IUPC placed  Labs: Lab Results  Component Value Date   WBC 10.2 11/15/2017   HGB 11.8 (L) 11/15/2017   HCT 34.0 (L) 11/15/2017   MCV 96.9 11/15/2017   PLT 121 (L) 11/15/2017    Assessment / Plan: Induction of labor due to preeclampsia,  progressing well on pitocin  Labor: Progressing normally Preeclampsia:  on magnesium sulfate, no signs or symptoms of toxicity, intake and ouput balanced and labs stable Fetal Wellbeing:  Category I Pain Control:  Epidural I/D:  n/a Anticipated MOD:  NSVD  Amy Yang 11/15/2017, 1:29 PM

## 2017-11-15 NOTE — Anesthesia Procedure Notes (Signed)
Epidural Patient location during procedure: OB Start time: 11/15/2017 11:36 AM End time: 11/15/2017 11:40 AM  Staffing Anesthesiologist: Leilani Able, MD Performed: anesthesiologist   Preanesthetic Checklist Completed: patient identified, site marked, surgical consent, pre-op evaluation, timeout performed, IV checked, risks and benefits discussed and monitors and equipment checked  Epidural Patient position: sitting Prep: site prepped and draped and DuraPrep Patient monitoring: continuous pulse ox and blood pressure Approach: midline Location: L3-L4 Injection technique: LOR air  Needle:  Needle type: Tuohy  Needle gauge: 17 G Needle length: 9 cm and 9 Needle insertion depth: 5 cm cm Catheter type: closed end flexible Catheter size: 19 Gauge Catheter at skin depth: 10 cm Test dose: negative and Other  Assessment Sensory level: T9 Events: blood not aspirated, injection not painful, no injection resistance, negative IV test and no paresthesia  Additional Notes Reason for block:procedure for pain

## 2017-11-15 NOTE — Plan of Care (Signed)
Pts. Condition will continue to improve 

## 2017-11-15 NOTE — Progress Notes (Signed)
Amy Yang is a 42 y.o. G2P1001 at [redacted]w[redacted]d by LMP admitted for induction of labor due to Pre-eclamptic toxemia of pregnancy..  Subjective: Comfortable, Slept well Occ headache. No epigastric pain  Objective: BP (!) 148/76   Pulse 83   Temp 98.5 F (36.9 C) (Oral)   Resp 16   Ht 5\' 7"  (1.702 m)   Wt 82.1 kg   BMI 28.35 kg/m  I/O last 3 completed shifts: In: 144.9 [I.V.:144.9] Out: -  Total I/O In: 774.9 [P.O.:200; I.V.:574.9] Out: 1450 [Urine:1450]  FHT:  FHR: 150 bpm, variability: moderate,  accelerations:  Present,  decelerations:  Absent UC:   irregular, every 5 minutes SVE:   Dilation: 2.5 Effacement (%): 50 Station: -2 Exam by:: Yang.Cox, RN  Labs: Lab Results  Component Value Date   WBC 9.4 11/14/2017   HGB 12.6 11/14/2017   HCT 36.6 11/14/2017   MCV 96.6 11/14/2017   PLT 135 (L) 11/14/2017    Assessment / Plan: Induction of labor due to preeclampsia,  progressing well on pitocin  Labor: Progressing normally Preeclampsia:  on magnesium sulfate, no signs or symptoms of toxicity, intake and ouput balanced and labs stable Fetal Wellbeing:  Category I Pain Control:  Labor support without medications I/D:  n/a Anticipated MOD:  NSVD  Amy Yang 11/15/2017, 6:36 AM

## 2017-11-15 NOTE — Progress Notes (Signed)
Amy Yang is a 42 y.o. G2P1001 at [redacted]w[redacted]d by LMP admitted for induction of labor due to Pre-eclamptic toxemia of pregnancy..  Subjective: Slept well. Headache with Magnesium  Objective: BP (!) 164/87   Pulse 69   Temp 98.5 F (36.9 C) (Oral)   Resp 20   Ht 5\' 7"  (1.702 m)   Wt 82.1 kg   BMI 28.35 kg/m  I/O last 3 completed shifts: In: 919.8 [P.O.:200; I.V.:719.8] Out: 1450 [Urine:1450] No intake/output data recorded.  FHT:  FHR: 120 bpm, variability: moderate,  accelerations:  Present,  decelerations:  Absent UC:   irregular, every 5 minutes SVE:   Dilation: 3.5 Effacement (%): 80 Station: -2 Exam by:: Dr. Billy Coast  AROM- clear  Labs: Lab Results  Component Value Date   WBC 9.4 11/14/2017   HGB 12.6 11/14/2017   HCT 36.6 11/14/2017   MCV 96.6 11/14/2017   PLT 135 (L) 11/14/2017    Assessment / Plan: Induction of labor due to preeclampsia,  progressing well on pitocin  Labor: Progressing normally Preeclampsia:  on magnesium sulfate, no signs or symptoms of toxicity, intake and ouput balanced and labs stable Fetal Wellbeing:  Category I Pain Control:  Labor support without medications I/D:  n/a Anticipated MOD:  NSVD  Amy Yang J 11/15/2017, 8:12 AM

## 2017-11-15 NOTE — Anesthesia Pain Management Evaluation Note (Signed)
  CRNA Pain Management Visit Note  Patient: Amy Yang, 42 y.o., female  "Hello I am a member of the anesthesia team at [facility]. We have an anesthesia team available at all times to provide care throughout the hospital, including epidural management and anesthesia for C-section. I don't know your plan for the delivery whether it a natural birth, water birth, IV sedation, nitrous supplementation, doula or epidural, but we want to meet your pain goals."   1.Was your pain managed to your expectations on prior hospitalizations?   Did not ask.  2.What is your expectation for pain management during this hospitalization?     Epidural  3.How can we help you reach that goal? Epidural in place but not working well at 1340. Epidural dosed as per chart. Asked RN to notify if patient does not get comfortable after dosing.  Record the patient's initial score and the patient's pain goal.   Pain: 9, 6 after dosing and after 10 more minutes should be improved.  Pain Goal: 5 [facility] wants you to be able to say your pain was always managed very well.  Wissam Resor 11/15/2017

## 2017-11-15 NOTE — Progress Notes (Signed)
This note also relates to the following rows which could not be included: BP - Cannot attach notes to unvalidated device data Pulse Rate - Cannot attach notes to unvalidated device data  VOV to stop pitocin x1 hour then restart at 53mu/min per Dr. Billy Coast.

## 2017-11-16 ENCOUNTER — Other Ambulatory Visit: Payer: Self-pay

## 2017-11-16 LAB — CBC
HEMATOCRIT: 25.3 % — AB (ref 36.0–46.0)
HEMOGLOBIN: 8.7 g/dL — AB (ref 12.0–15.0)
MCH: 33 pg (ref 26.0–34.0)
MCHC: 34.4 g/dL (ref 30.0–36.0)
MCV: 95.8 fL (ref 78.0–100.0)
Platelets: 124 10*3/uL — ABNORMAL LOW (ref 150–400)
RBC: 2.64 MIL/uL — ABNORMAL LOW (ref 3.87–5.11)
RDW: 12.8 % (ref 11.5–15.5)
WBC: 9.4 10*3/uL (ref 4.0–10.5)

## 2017-11-16 LAB — COMPREHENSIVE METABOLIC PANEL
ALBUMIN: 2.2 g/dL — AB (ref 3.5–5.0)
ALK PHOS: 93 U/L (ref 38–126)
ALT: 23 U/L (ref 0–44)
AST: 37 U/L (ref 15–41)
Anion gap: 9 (ref 5–15)
BILIRUBIN TOTAL: 0.6 mg/dL (ref 0.3–1.2)
BUN: 12 mg/dL (ref 6–20)
CALCIUM: 6.7 mg/dL — AB (ref 8.9–10.3)
CO2: 22 mmol/L (ref 22–32)
Chloride: 99 mmol/L (ref 98–111)
Creatinine, Ser: 0.89 mg/dL (ref 0.44–1.00)
GFR calc Af Amer: >60 mL/min (ref >60)
GLUCOSE: 98 mg/dL (ref 70–99)
Potassium: 4.3 mmol/L (ref 3.5–5.1)
Sodium: 130 mmol/L — ABNORMAL LOW (ref 135–145)
TOTAL PROTEIN: 4.3 g/dL — AB (ref 6.5–8.1)

## 2017-11-16 MED ORDER — LACTATED RINGERS IV SOLN
INTRAVENOUS | Status: DC
  Administered 2017-11-16: 05:00:00 via INTRAVENOUS

## 2017-11-16 MED ORDER — METOCLOPRAMIDE HCL 5 MG/ML IJ SOLN
10.0000 mg | Freq: Four times a day (QID) | INTRAMUSCULAR | Status: DC | PRN
  Administered 2017-11-16 – 2017-11-18 (×6): 10 mg via INTRAVENOUS
  Filled 2017-11-16 (×6): qty 2

## 2017-11-16 MED ORDER — POLYSACCHARIDE IRON COMPLEX 150 MG PO CAPS
150.0000 mg | ORAL_CAPSULE | Freq: Every day | ORAL | Status: DC
  Administered 2017-11-16 – 2017-11-19 (×4): 150 mg via ORAL
  Filled 2017-11-16 (×5): qty 1

## 2017-11-16 NOTE — Anesthesia Postprocedure Evaluation (Signed)
Anesthesia Post Note  Patient: Amy Yang  Procedure(s) Performed: AN AD HOC LABOR EPIDURAL     Patient location during evaluation: [facility] Anesthesia Type: Epidural Level of consciousness: awake and alert Pain management: pain level controlled Vital Signs Assessment: post-procedure vital signs reviewed and stable Respiratory status: spontaneous breathing Cardiovascular status: blood pressure returned to baseline Postop Assessment: no headache, no backache, epidural receding, able to ambulate, adequate PO intake, no apparent nausea or vomiting and patient able to bend at knees Anesthetic complications: no    Last Vitals:  Vitals:   11/16/17 0435 11/16/17 0600  BP: 138/84   Pulse: 70   Resp: 18 20  Temp: 36.9 C   SpO2: 97%     Last Pain:  Vitals:   11/16/17 0600  TempSrc:   PainSc: 0-No pain   Pain Goal: Patients Stated Pain Goal: 8 (11/15/17 0730)               Mccayla Shimada

## 2017-11-16 NOTE — Lactation Note (Signed)
This note was copied from a baby's chart. Lactation Consultation Note  Patient Name: Amy Yang [id number] Date: 11/16/2017 Reason for consult: Initial assessment;Early term 28-38.6wks  20 hours old female who is being exclusively BF by his mother, she's a P2 and experienced BF. She was able to BF her oldest child for 12 months, and her main concerns/BF challenges were oversupply and a strong MER. Mom had to donate a lot of the milk she pumped with her first baby. RN Eunice Blase called for [facility] assistance, mom refused to pump for fear of triggering oversupply. Mom has a Medela DEBP at home; and she's familiar with hand expression.  Visited with mom and explained to her (and dad, he was present and supportive) the benefits of giving an early term baby some colostrum. When offered assistance with latch, mom politely declined stating baby already fed, she's been expressing her colostrum directly into baby's mouth.  Discussed volumes for supplementation with mother's milk and encouraged mom to either pump or hand express. Offered a hand pump since she was reluctant to double pump, and she agreed to use it. Pump instructions, cleaning and storage were reviewed, as well as milk storage guidelines. Mom was also given a size 27 mm flange per her request. Mom was also given a curved tip syringe, measuring cups and snappies for milk collection and feedings. Reviewed the use of each instrument with parents.  Plan:  1. Encouraged mom to feed baby at the breast STS every 3 hours or sooner if feeding cues are present. If baby does not cue in a 3 hour period mom will wake him up to feed 2. Mom will use either hand expression or her hand pump to pump every 3 hours (after feedings) and supplement baby her EBM using a curved tip syring or a feeding cup 3. Parent will increase the volumes for supplementation (EBM) every 24 hours, as baby gets older.  BF brochure, BF resources and feeding diary were shared. Parents were very  engaged in [facility] consultation and they reported all their questions were answered. They're both aware of LC services and will call PRN.  Maternal Data Formula Feeding for Exclusion: No Has patient been taught Hand Expression?: Yes Does the patient have breastfeeding experience prior to this delivery?: Yes  Feeding Feeding Type: Breast Fed Length of feed: 12 min  LATCH Score Latch: Too sleepy or reluctant, no latch achieved, no sucking elicited.  Audible Swallowing: None  Type of Nipple: Everted at rest and after stimulation  Comfort (Breast/Nipple): Soft / non-tender  Hold (Positioning): Assistance needed to correctly position infant at breast and maintain latch.  LATCH Score: 5  Interventions Interventions: Breast feeding basics reviewed;Hand pump  Lactation Tools Discussed/Used Tools: Pump Breast pump type: Manual WIC Program: No Pump Review: Setup, frequency, and cleaning;Milk Storage Initiated by:: MPeck Date initiated:: 11/16/17   Consult Status Consult Status: Follow-up Date: 11/17/17 Follow-up type: In-patient    Deshara Rossi Venetia Constable 11/16/2017, 12:57 PM

## 2017-11-16 NOTE — Progress Notes (Signed)
Patient ID: Amy Yang, female   DOB: 05-30-1975, 42 y.o.   MRN: [id number]  Post Partum Day # 1, s/p SVD, Pre-eclampsia on Magnesium sulfate  Subjective:  Patient sitting in bed, holding infant skin to skin. Expresses concerns with infants lack of interest in breastfeeding. Last "good feeding" at 0100.  Reports headache with relief from percocet. Eating, drinking, urinating, and passing gas without difficulty. FOB at bedside.   Denies difficulty breathing or respiratory distress, chest pain, abdominal pain, excessive vaginal bleeding, dysuria, and leg pain or swelling.   Objective:  Temp:  [98.5 F (36.9 C)-99.3 F (37.4 C)] 98.5 F (36.9 C) (09/11 0435) Pulse Rate:  [63-97] 63 (09/11 0759) Resp:  [18-20] 20 (09/11 0759) BP: (137-181)/(75-117) 145/85 (09/11 0759) SpO2:  [97 %-100 %] 100 % (09/11 0759)    Intake/Output Summary (Last 24 hours) at 11/16/2017 1206 Last data filed at 11/16/2017 0759 Gross per 24 hour  Intake 4284.43 ml  Output 4870 ml  Net -585.57 ml    Physical Exam:   General: alert and cooperative   Lungs: clear to auscultation bilaterally  Breasts: normal appearance, no masses or tenderness  Heart: normal apical impulse  Abdomen: soft, non-tender; bowel sounds normal; no masses,  no organomegaly  Pelvis: Lochia: appropriate, Uterine Fundus: firm  Extremities: DVT Evaluation: No evidence of DVT seen on physical exam  CBC Latest Ref Rng & Units 11/16/2017 11/15/2017 11/15/2017  WBC 4.0 - 10.5 K/uL 9.4 15.5(H) 10.2  Hemoglobin 12.0 - 15.0 g/dL 2.8(M) 11.2(L) 11.8(L)  Hematocrit 36.0 - 46.0 % 25.3(L) 32.3(L) 34.0(L)  Platelets 150 - 400 K/uL 124(L) 139(L) 121(L)   CMP Latest Ref Rng & Units 11/16/2017 11/15/2017 11/14/2017  Glucose 70 - 99 mg/dL 98 034(J) 92  BUN 6 - 20 mg/dL 12 9 15   Creatinine 0.44 - 1.00 mg/dL 1.79 1.50 5.69  Sodium 135 - 145 mmol/L 130(L) 132(L) 135  Potassium 3.5 - 5.1 mmol/L 4.3 3.9 4.2  Chloride 98 - 111 mmol/L 99 102 104  CO2  22 - 32 mmol/L 22 19(L) 20(L)  Calcium 8.9 - 10.3 mg/dL 6.7(L) 7.5(L) 9.0  Total Protein 6.5 - 8.1 g/dL 4.3(L) 6.0(L) 5.9(L)  Total Bilirubin 0.3 - 1.2 mg/dL 0.6 0.6 0.4  Alkaline Phos 38 - 126 U/L 93 130(H) 141(H)  AST 15 - 41 U/L 37 31 37  ALT 0 - 44 U/L 23 24 26     Assessment:  42 year old G2P2, postpartum day #1, status post spontaneous vaginal birth, Rh positive, Pre-eclampsia on magnesium, postpartum anemia  Breastfeeding  Plan:  Encouraged pumping or hand expressing of milk to increase feeding options. Suggested spoon, syringe or cup feeding as needed.   Rx: Iron and Reglan, see orders.   Routine postpartum care and orders.   Will stop Magnesium at 24 hours postpartum.   Reviewed red flag symptoms and when to call.   Continue orders as written. Reassess as needed.   Plan of care reviewed with Dr. Billy Coast.    LOS: 2 days    Gunnar Bulla, CNM [facility] 11/16/2017 11:20 AM

## 2017-11-17 DIAGNOSIS — O1490 Unspecified pre-eclampsia, unspecified trimester: Secondary | ICD-10-CM | POA: Diagnosis present

## 2017-11-17 LAB — COMPREHENSIVE METABOLIC PANEL
ALK PHOS: 103 U/L (ref 38–126)
ALT: 36 U/L (ref 0–44)
ANION GAP: 8 (ref 5–15)
AST: 43 U/L — ABNORMAL HIGH (ref 15–41)
Albumin: 2.5 g/dL — ABNORMAL LOW (ref 3.5–5.0)
BILIRUBIN TOTAL: 0.7 mg/dL (ref 0.3–1.2)
BUN: 17 mg/dL (ref 6–20)
CALCIUM: 8.6 mg/dL — AB (ref 8.9–10.3)
CO2: 23 mmol/L (ref 22–32)
CREATININE: 0.79 mg/dL (ref 0.44–1.00)
Chloride: 107 mmol/L (ref 98–111)
GFR calc Af Amer: >60 mL/min (ref >60)
Glucose, Bld: 102 mg/dL — ABNORMAL HIGH (ref 70–99)
Potassium: 4.3 mmol/L (ref 3.5–5.1)
Sodium: 138 mmol/L (ref 135–145)
TOTAL PROTEIN: 4.9 g/dL — AB (ref 6.5–8.1)

## 2017-11-17 LAB — CBC
HCT: 25.6 % — ABNORMAL LOW (ref 36.0–46.0)
Hemoglobin: 8.8 g/dL — ABNORMAL LOW (ref 12.0–15.0)
MCH: 34 pg (ref 26.0–34.0)
MCHC: 34.4 g/dL (ref 30.0–36.0)
MCV: 98.8 fL (ref 78.0–100.0)
Platelets: 145 10*3/uL — ABNORMAL LOW (ref 150–400)
RBC: 2.59 MIL/uL — AB (ref 3.87–5.11)
RDW: 13.2 % (ref 11.5–15.5)
WBC: 7.9 10*3/uL (ref 4.0–10.5)

## 2017-11-17 MED ORDER — HYDROCHLOROTHIAZIDE 12.5 MG PO CAPS
12.5000 mg | ORAL_CAPSULE | Freq: Once | ORAL | Status: AC
  Administered 2017-11-17: 12.5 mg via ORAL
  Filled 2017-11-17: qty 1

## 2017-11-17 MED ORDER — VALACYCLOVIR HCL 500 MG PO TABS
1000.0000 mg | ORAL_TABLET | Freq: Two times a day (BID) | ORAL | Status: DC
  Administered 2017-11-17 – 2017-11-18 (×2): 1000 mg via ORAL
  Filled 2017-11-17 (×4): qty 2

## 2017-11-17 MED ORDER — SODIUM CHLORIDE 0.9 % IV SOLN
510.0000 mg | Freq: Once | INTRAVENOUS | Status: AC
  Administered 2017-11-17: 510 mg via INTRAVENOUS
  Filled 2017-11-17: qty 17

## 2017-11-17 MED ORDER — INFLUENZA VAC SPLIT QUAD 0.5 ML IM SUSY: 0.5000 mL | PREFILLED_SYRINGE | INTRAMUSCULAR | Status: DC

## 2017-11-17 MED ORDER — LABETALOL HCL 200 MG PO TABS
200.0000 mg | ORAL_TABLET | Freq: Three times a day (TID) | ORAL | Status: DC
  Administered 2017-11-17 – 2017-11-18 (×5): 200 mg via ORAL
  Filled 2017-11-17 (×5): qty 1

## 2017-11-17 NOTE — Progress Notes (Signed)
PPD 2 SVD with 2nd degree repair / preeclampsia s/p Magnesium sulfate prophylaxis  S:  Reports feeling ok - tired / no PIH symptoms, reports headaches have resolved                  Reports additionally carpal tunnel pain bilaterally - wrist splints at home             Tolerating po/ No nausea or vomiting             Bleeding is light             Pain controlled with Motrin             Up ad lib / ambulatory / voiding QS  Newborn Breast / Circumcision planned prior to DC  O:  VS: BP (!) 157/90 (BP Location: Left Arm)   Pulse 69   Temp 98.5 F (36.9 C) (Oral)   Resp 18   Ht 5\' 7"  (1.702 m)   Wt 82.1 kg   SpO2 99%   Breastfeeding? Unknown   BMI 28.35 kg/m    BP: 157/90 - 168/80 - 155/88 - 159/90  LABS:             Recent Labs    11/15/17 1719 11/16/17 0603  WBC 15.5* 9.4  HGB 11.2* 8.7*  PLT 139* 124*               Blood type: --/--/A POS, A POS Performed at [facility], [address] [place], [place] [address]  [phone](09/09 1644)  Rubella: Immune (09/09 1617)                    I&O: Intake/Output      09/11 0701 - 09/12 0700 09/12 0701 - 09/13 0700   P.O. 1820    I.V. (mL/kg) 1133.2 (13.8)    Other     Total Intake(mL/kg) 2953.2 (36)    Urine (mL/kg/hr) 5500 (2.8)    Blood     Total Output 5500    Net -2546.8                    Physical Exam:             Alert and oriented X3  Lungs: Clear and unlabored  Heart: regular rate and rhythm / no mumurs  Abdomen: soft, non-tender, non-distended              Fundus: firm, non-tender, U-1  Perineum: no edema  Lochia: light  Extremities: trace edema, no calf pain or tenderness    A: PPD # 2 SVD with 2nd degree repair             preeclamsia - s/p magnesium sulfate diuresed well / BP labile on labetalol 100 TID             ABL anemia             Carpal Tunnel syndrome- bilateral  Doing well - stable status  P: Routine post partum orders  HCTZ 12.5 x 1 to help reduced fluid in hands for CTS   Consult dr Billy Coastaavon - labile hypertension with labetalol 100 TID / aware of hgb after diuresis             Labetalol increased to 200mg  per day             IVPB iron infusion today  Circumcision prior to DC             Consider DC tomorrow if BP improved  Marlinda Mike CNM, MSN, [facility] 11/17/2017, 8:20 AM

## 2017-11-17 NOTE — Progress Notes (Addendum)
Midwife Fredric MareBailey was notified at 2330 about blood pressure still in critical range. Dr. Billy Coastaavon not notified.

## 2017-11-17 NOTE — Lactation Note (Signed)
This note was copied from a baby's chart. Lactation Consultation Note; Experienced BF mom reports baby is feeding much better than yesterday. Baby waking and showing feeding cues. Offered assist and mom agreeable. Mom reports breasts are feeling fuller this morning. Able to hand express whitish milk. Baby latched well with lots of swallows. Mom having trouble with carpel tunnel and her hands going to sleep. Encouragement given. Baby for circ today. Reviewed normal behavior after circ. Unsure if she will go home today due to her BP. Reviewed our phone number, OP appointments and BFSG as resources for support after DC. No questions at present.   Patient Name: Amy Yang [id number]'s Date: 11/17/2017 Reason for consult: Follow-up assessment;Early term 37-38.6wks   Maternal Data Formula Feeding for Exclusion: No Has patient been taught Hand Expression?: Yes Does the patient have breastfeeding experience prior to this delivery?: Yes  Feeding Feeding Type: Breast Fed Length of feed: 25 min  LATCH Score Latch: Grasps breast easily, tongue down, lips flanged, rhythmical sucking.  Audible Swallowing: Spontaneous and intermittent  Type of Nipple: Everted at rest and after stimulation  Comfort (Breast/Nipple): Soft / non-tender  Hold (Positioning): Assistance needed to correctly position infant at breast and maintain latch.  LATCH Score: 9  Interventions Interventions: Breast feeding basics reviewed  Lactation Tools Discussed/Used WIC Program: No   Consult Status Consult Status: PRN    Pamelia HoitWeeks, Emri Sample D 11/17/2017, 8:27 AM

## 2017-11-17 NOTE — Progress Notes (Addendum)
Midwife notified about patients blood pressures at 2035. Orders for CBC and CMP were put in. Billy Coastaavon was notified as well and stated since patient is on Labetalol 200 mg to just watch her and monitor closely. Pending labs will add Mag prophylaxis and/or add procardia. Protocol 20, 40, 80 of labetalol was pushed as well as hydralazine 10 mg. Pressures stay around 160's over 90's. Pt Asymptomatic.

## 2017-11-18 MED ORDER — NIFEDIPINE 10 MG PO CAPS: 10.0000 mg | ORAL_CAPSULE | ORAL | Status: DC | PRN

## 2017-11-18 MED ORDER — NIFEDIPINE ER OSMOTIC RELEASE 30 MG PO TB24
30.0000 mg | ORAL_TABLET | Freq: Two times a day (BID) | ORAL | Status: DC
  Administered 2017-11-18 – 2017-11-19 (×2): 30 mg via ORAL
  Filled 2017-11-18 (×2): qty 1

## 2017-11-18 MED ORDER — VALACYCLOVIR HCL 500 MG PO TABS
2000.0000 mg | ORAL_TABLET | Freq: Two times a day (BID) | ORAL | Status: DC
  Administered 2017-11-18 – 2017-11-19 (×2): 2000 mg via ORAL
  Filled 2017-11-18 (×4): qty 4

## 2017-11-18 MED ORDER — NIFEDIPINE ER OSMOTIC RELEASE 30 MG PO TB24
30.0000 mg | ORAL_TABLET | Freq: Every day | ORAL | Status: DC
  Administered 2017-11-18: 30 mg via ORAL
  Filled 2017-11-18: qty 1

## 2017-11-18 MED ORDER — NIFEDIPINE 10 MG PO CAPS
10.0000 mg | ORAL_CAPSULE | ORAL | Status: AC
  Administered 2017-11-18: 10 mg via ORAL
  Filled 2017-11-18: qty 1

## 2017-11-18 NOTE — Lactation Note (Signed)
This note was copied from a baby's chart. Lactation Consultation Note  Patient Name: Amy Yang ZOXWR'UToday's Date: 11/18/2017 Reason for consult: Follow-up assessment;Early term 10637-38.6wks Mom reports baby is having some difficulty latching since breasts have become full.  Mom pre pumped breast and obtained 30 mls prior to latch.  Assisted mom with positioning baby in football hold.  Baby latched easily and fed actively for 15 minutes.  Many audible swallows and breast softening noted.  Baby burped and then latched to opposite breast in cross cradle hold.  Baby swallowing frequently and fell asleep after 5 minutes.  Baby relaxed and content.  Instructed to feed with cues and call for assist/concerns prn.  Maternal Data    Feeding Feeding Type: Breast Fed Length of feed: 20 min  LATCH Score Latch: Grasps breast easily, tongue down, lips flanged, rhythmical sucking.  Audible Swallowing: Spontaneous and intermittent  Type of Nipple: Everted at rest and after stimulation  Comfort (Breast/Nipple): Soft / non-tender  Hold (Positioning): Assistance needed to correctly position infant at breast and maintain latch.  LATCH Score: 9  Interventions Interventions: Assisted with latch;Breast compression;Skin to skin;Adjust position;Breast massage;Support pillows;Position options;Pre-pump if needed  Lactation Tools Discussed/Used Pump Review: Setup, frequency, and cleaning;Milk Storage Initiated by:: RN Date initiated:: 11/18/17   Consult Status Consult Status: Follow-up Date: 11/19/17 Follow-up type: In-patient    Huston FoleyMOULDEN, Yaris Ferrell S 11/18/2017, 9:00 AM

## 2017-11-18 NOTE — Plan of Care (Signed)
  Problem: Clinical Measurements: Goal: Diagnostic test results will improve Outcome: Progressing Note:  Pt blood pressures were severe range this am, but have decreased with procardia.     Problem: Coping: Goal: Ability to identify and utilize available resources and services will improve Outcome: Progressing   Problem: Life Cycle: Goal: Chance of risk for complications during the postpartum period will decrease Outcome: Progressing Note:  Pt bleeding scant-small without clots.   Problem: Skin Integrity: Goal: Demonstration of wound healing without infection will improve Outcome: Progressing Note:  Pt educated on sitz bath & has used this shift.   Problem: Education: Goal: Knowledge of the prescribed therapeutic regimen will improve Outcome: Progressing   Problem: Fluid Volume: Goal: Peripheral tissue perfusion will improve Outcome: Progressing Note:  1+ pitting edema noted this shift.  Pt voids large amts with adequate frequency.   Problem: Clinical Measurements: Goal: Complications related to disease process, condition or treatment will be avoided or minimized Outcome: Progressing

## 2017-11-18 NOTE — Progress Notes (Addendum)
Dr Billy Coastaavon called to unit, report given re: labs & consistent systolic >160. MD states that due to patient symptoms unchanged, plan will be to manage severe range pressures with PO Procardia. MD declines to start labetalol protocol for future severe range Bps this AM. RN instructed to given 10mg  procardia PO if BP next Bp in severe range and add Procardia XL 30mg .

## 2017-11-18 NOTE — Progress Notes (Addendum)
Patient ID: Amy LunaJulia E Gebhard, female   DOB: 1975-05-12, 42 y.o.   MRN: [id number]  Post Partum Day # 3, s/p SVD with repaired second degree perineal lacerations, pre-eclampsia s/p magnesium sulfate   Subjective:  Patient sitting in bed. Infant in bassinet at bedside. FOB in room. Questions regarding plan of care and options for blood pressure management.   Denies headache unrelieved by medications, blurred vision, difficulty breathing or respiratory distress, chest pain, abdominal pain, excessive vaginal bleeding, dysuria, and leg pain or swelling.   Objective:  Temp:  [98.1 F (36.7 C)-99.4 F (37.4 C)] 99.4 F (37.4 C) (09/13 0749) Pulse Rate:  [64-81] 64 (09/13 0911) Resp:  [16-18] 18 (09/13 0749) BP: (148-174)/(73-93) 167/90 (09/13 0911) SpO2:  [96 %-97 %] 97 % (09/12 2308)   Intake/Output Summary (Last 24 hours) at 11/18/2017 0933 Last data filed at 11/18/2017 0911 Gross per 24 hour  Intake 354.02 ml  Output 5650 ml  Net -5295.98 ml   Physical Exam:   General: alert and cooperative   Lungs: clear to auscultation bilaterally  Breasts: deferred, no complaints  Heart: normal apical impulse  Abdomen: soft, non-tender; bowel sounds normal; no masses,  no organomegaly  Pelvis: Lochia: appropriate, Uterine Fundus: firm  Extremities: DVT Evaluation: no evidence of DVT seen on physical exam.  CBC Latest Ref Rng & Units 11/17/2017 11/16/2017 11/15/2017  WBC 4.0 - 10.5 K/uL 7.9 9.4 15.5(H)  Hemoglobin 12.0 - 15.0 g/dL 4.0(J8.8(L) 8.1(X8.7(L) 11.2(L)  Hematocrit 36.0 - 46.0 % 25.6(L) 25.3(L) 32.3(L)  Platelets 150 - 400 K/uL 145(L) 124(L) 139(L)   CMP Latest Ref Rng & Units 11/17/2017 11/16/2017 11/15/2017  Glucose 70 - 99 mg/dL 914(N102(H) 98 829(F113(H)  BUN 6 - 20 mg/dL 17 12 9   Creatinine 0.44 - 1.00 mg/dL 6.210.79 3.080.89 6.570.73  Sodium 135 - 145 mmol/L 138 130(L) 132(L)  Potassium 3.5 - 5.1 mmol/L 4.3 4.3 3.9  Chloride 98 - 111 mmol/L 107 99 102  CO2 22 - 32 mmol/L 23 22 19(L)  Calcium 8.9 - 10.3  mg/dL 8.4(O8.6(L) 6.7(L) 7.5(L)  Total Protein 6.5 - 8.1 g/dL 4.9(L) 4.3(L) 6.0(L)  Total Bilirubin 0.3 - 1.2 mg/dL 0.7 0.6 0.6  Alkaline Phos 38 - 126 U/L 103 93 130(H)  AST 15 - 41 U/L 43(H) 37 31  ALT 0 - 44 U/L 36 23 24   Assessment:  42 year old G2P2, postpartum day #1, status post spontaneous vaginal birth, repaired second degree perineal laceration, Rh positive, Pre-eclampsia s/p magnesium, persistent elevated blood pressures on labetalol, postpartum anemia  Breastfeeding  Plan:  Plan of care reviewed with Dr. Billy Coastaavon. MD previously advised RN earlier in shift to give Procardia immediate release for elevated blood pressures. Medication not given yet when CNM arrived on unit.   Rx: Procardia 10 mg PO give now, see orders.   Rx: ProcardiaXL 30 mg PO daily start now, see orders.   Questions answered and plan of care discussed with patient and spouse, each verbalized understanding.   Reviewed red flag symptoms and when to call.   Continue orders as written. Reassess as needed.    LOS: 4 days    Gunnar BullaJenkins Michelle Romonda Parker, CNM [facility] 11/18/2017 9:28 AM

## 2017-11-18 NOTE — Progress Notes (Signed)
Midwife notified of morning BP 164/88. Still in critical range. Ordered to take blood pressure at 8 am and will go from there. No new orders

## 2017-11-19 MED ORDER — OXYCODONE-ACETAMINOPHEN 5-325 MG PO TABS: 2.0000 | ORAL_TABLET | ORAL | 0 refills | Status: DC | PRN

## 2017-11-19 MED ORDER — NIFEDIPINE ER 30 MG PO TB24: 30.0000 mg | ORAL_TABLET | Freq: Two times a day (BID) | ORAL | 0 refills | Status: DC

## 2017-11-19 MED ORDER — IBUPROFEN 600 MG PO TABS: 600.0000 mg | ORAL_TABLET | Freq: Four times a day (QID) | ORAL | 0 refills | Status: DC

## 2017-11-19 MED ORDER — LABETALOL HCL 300 MG PO TABS: 300.0000 mg | ORAL_TABLET | Freq: Three times a day (TID) | ORAL | 0 refills | Status: DC

## 2017-11-19 MED ORDER — WITCH HAZEL-GLYCERIN EX PADS: 1.0000 "application " | MEDICATED_PAD | CUTANEOUS | 12 refills | Status: DC | PRN

## 2017-11-19 MED ORDER — LABETALOL HCL 200 MG PO TABS
300.0000 mg | ORAL_TABLET | Freq: Three times a day (TID) | ORAL | Status: DC
  Administered 2017-11-19: 300 mg via ORAL
  Filled 2017-11-19: qty 1

## 2017-11-19 MED ORDER — POLYSACCHARIDE IRON COMPLEX 150 MG PO CAPS: 150.0000 mg | ORAL_CAPSULE | Freq: Every day | ORAL | 1 refills | Status: DC

## 2017-11-19 MED ORDER — DIBUCAINE 1 % RE OINT: 1.0000 "application " | TOPICAL_OINTMENT | RECTAL | 0 refills | Status: DC | PRN

## 2017-11-19 MED ORDER — LABETALOL HCL 200 MG PO TABS: 300.0000 mg | ORAL_TABLET | Freq: Three times a day (TID) | ORAL | Status: DC

## 2017-11-19 NOTE — Progress Notes (Signed)
Discharge instructions given to patient and she verbalized understanding of all instructions given. Written copy of AVS given to patient. 

## 2017-11-19 NOTE — Lactation Note (Signed)
Lactation Consultation Note  Patient Name: Erroll LunaJulia E Dawkins VWUJW'JToday's Date: 11/19/2017   Visited with P2 Mom of ET infant at 694 day old  Baby discharged yesterday, staying in room with Mom who is still a patient due to elevated BPs.    Assisted with positioning in cross cradle hold.  Both breasts full, not engorged.  Mom has been hand pumping prior to latch to get the areola softened. Mom assisted with using a U hold to sandwich breast, and wait for a wider mouth before he is brought onto breast.  Baby with a suck/swallow ratio of 1:1, and breast softening well.   Encouraged continued STS, and feeding baby often when he cues.  Baby has been having voids and stools.  Goal is >8 feedings per 24 hrs.   Mom informed of OP lactation services and encouraged to call prn.   Judee ClaraSmith, Aleisa Howk E 11/19/2017, 12:51 PM

## 2017-11-19 NOTE — Discharge Summary (Signed)
Obstetric Discharge Summary  Patient ID: Amy Yang MRN: [id number] DOB/AGE: [birth year] 40 y.o.   Date of Admission: 11/14/2017  Date of Discharge:  11/19/17  Admitting Diagnosis: Induction of labor at [redacted]w[redacted]d  Secondary Diagnosis: Preeclampsia, Blood loss anemia  Mode of Delivery: normal spontaneous vaginal delivery     Discharge Diagnosis: No other diagnosis   Intrapartum Procedures: Atificial rupture of membranes, epidural and pitocin augmentation   Post partum procedures: IV Iron Transfusion  Complications: vaginal laceration, repaired   Brief Hospital Course   Amy Yang is a [id number] who had a SVD on 11/15/2017;  for further details of this birth, please refer to the delivey note.  Patient's postpartum course was complicated by pre-eclampsia with persistently elevated blood pressures after 24 hours of magnesium sulfate.  Oral labetalol and procardia doses were titrated to prevent severe range blood pressures. By time of discharge on PPD#4, her pain and vital signs were controlled on oral pain medications; she had appropriate lochia and was ambulating, voiding without difficulty and tolerating regular diet.  She was deemed stable for discharge to home with strict follow up precautions.    Labs:  CBC Latest Ref Rng & Units 11/17/2017 11/16/2017 11/15/2017  WBC 4.0 - 10.5 K/uL 7.9 9.4 15.5(H)  Hemoglobin 12.0 - 15.0 g/dL 1.4(N) 8.2(N) 11.2(L)  Hematocrit 36.0 - 46.0 % 25.6(L) 25.3(L) 32.3(L)  Platelets 150 - 400 K/uL 145(L) 124(L) 139(L)   Conflict (See Lab Report): A POS/A POS Performed at [facility], [address], [place], [place] [address]   Physical exam:   Temp:  [98 F (36.7 C)-99 F (37.2 C)] 98.7 F (37.1 C) (09/14 1201) Pulse Rate:  [72-86] 79 (09/14 1201) Resp:  [16-17] 16 (09/14 1201) BP: (140-154)/(83-97) 144/93 (09/14 1201) SpO2:  [95 %-98 %] 97 % (09/14 1201)  General: alert and no distress  Lochia: appropriate  Abdomen: soft, NT  Uterine  Fundus: firm  Perineum: healing well, no significant drainage, no dehiscence, no significant erythema  Extremities: no evidence of DVT seen on physical exam  Discharge Instructions: Per After Visit Summary.  Activity: Advance as tolerated. Pelvic rest for 6 weeks.  Also refer to After Visit Summary  Diet: Regular  Medications: Allergies as of 11/19/2017      Reactions   Latex    Morphine And Related Nausea And Vomiting   Pineapple    Rhinocort [budesonide]    Symbicort [budesonide-formoterol Fumarate] Swelling      Medication List    STOP taking these medications   acetaminophen-codeine 300-30 MG tablet Commonly known as:  TYLENOL #3   ASPIRIN 81 PO   FIORICET PO   NIFEdipine 10 MG capsule Commonly known as:  PROCARDIA Replaced by:  NIFEdipine 30 MG 24 hr tablet     TAKE these medications   BENADRYL ALLERGY 25 MG tablet Generic drug:  diphenhydrAMINE Take 25 mg by mouth daily as needed (unk).   dibucaine 1 % Oint Commonly known as:  NUPERCAINAL Place 1 application rectally as needed for hemorrhoids.   FISH OIL PO Take 1 capsule by mouth daily.   ibuprofen 600 MG tablet Commonly known as:  ADVIL,MOTRIN Take 1 tablet (600 mg total) by mouth every 6 (six) hours.   iron polysaccharides 150 MG capsule Commonly known as:  NIFEREX Take 1 capsule (150 mg total) by mouth daily. Start taking on:  11/20/2017   labetalol 300 MG tablet Commonly known as:  NORMODYNE Take 1 tablet (300 mg total) by mouth every 8 (  eight) hours.   NIFEdipine 30 MG 24 hr tablet Commonly known as:  PROCARDIA-XL/ADALAT CC Take 1 tablet (30 mg total) by mouth every 12 (twelve) hours. Replaces:  NIFEdipine 10 MG capsule   oxyCODONE-acetaminophen 5-325 MG tablet Commonly known as:  PERCOCET/ROXICET Take 2 tablets by mouth every 4 (four) hours as needed (pain scale > 7).   PRENATAL VITAMIN PO Take 1 tablet by mouth daily.   VALTREX 1000 MG tablet Generic drug:  valACYclovir Take 2 g  by mouth 2 (two) times daily as needed (cold sores). x2 days. When there is an outbreak   witch hazel-glycerin pad Commonly known as:  TUCKS Apply 1 application topically as needed for hemorrhoids.      Outpatient follow up:  Follow-up Information    [facility]).   Why:  Please call the office to schedule a blood pressure check in three (3) days Contact information: [address] [place] [address] [phone]        Olivia Mackieaavon, Richard, MD Follow up in 6 week(s).   Specialty:  Obstetrics and Gynecology Why:  The office will call you to schedule a six (6) week postpartum visit with Dr. Lowella Gripaavon Contact information: [address] [place] [id number] [phone]          Postpartum contraception: abstinence  Discharged Condition: stable  Discharged to: home   Newborn Data:  Disposition:home with mother  Apgars: APGAR (1 MIN): 7   APGAR (5 MINS): 9    Baby Feeding: Breast   Gunnar BullaJenkins Michelle Jamieka Royle, CNM [facility] 11/19/17 1:13 PM

## 2017-11-19 NOTE — Discharge Instructions (Signed)
Care of a Perineal Tear °A perineal tear is a cut (laceration) in the tissue between the opening of the vagina and the anus (perineum). Some women naturally develop a perineal tear during a vaginal birth. This can happen as the baby emerges from the birth canal and the perineum is stretched. Perineal tears are graded based on how deep and long the laceration is. The grading for perineal tears is as follows: °· First degree. This involves a shallow tear at the edge of the vaginal opening that extends slightly into the perineal skin. °· Second degree. This involves tearing described in a first degree perineal tear and also a deeper tear of the vaginal opening and perineal tissues. It may also include tearing of a muscle just under the perineal skin. °· Third degree. This involves tearing described in a first and second degree perineal tear, with the tear extending into the muscle of the anus (anal sphincter). °· Fourth degree. This involves all levels of tear described for first, second, and third degree perineal tear, with the tear extending into the rectum. ° °First degree perineal tears may or may not be stitched closed, depending on their location and appearance. Second, third, and fourth degree perineal tears are stitched closed immediately after the baby’s birth. °What are the risks? °Depending on the type of perineal tear you have, you may be at risk for the following: °· Bleeding. °· Developing a collection of blood in the perineal tear area (hematoma). °· Pain. This may include pain with urination or bowel movements. °· Infection at the site of the tear. °· Fever. °· Trouble controlling your bowels (fecal incontinence). °· Painful sexual intercourse. ° °How to care for a perineal tear °· The first day, put ice on the area of the tear. °? Put ice in a plastic bag. °? Place a towel between your skin and the bag. °? Leave the ice on for 20 minutes, 2-3 times a day. °· Bathe using a warm sitz bath as directed by  your health care provider. This can speed up healing. Sitz baths can be performed in your bathtub or using a sitz bath kit that fits over your toilet. °? Place 3-4 in. (7.6-10 cm) of warm water in your bathtub or fill the sitz bath over-the-toilet container with warm water. Make sure the water is not too hot by placing a drop on your wrist. °? Sit in the warm water for 20-30 minutes. °? After bathing, pat your perineum dry with a clean towel. Do not scrub the perineum as this could cause pain, irritation, or open any stitches you may have. °? Keep the over-the-toilet sitz bath container clean by rinsing it thoroughly after each use. Ask for help in keeping the bathtub clean with diluted bleach and water (2 Tbsp [30 mL] of bleach to ½ gal [1.9 L] of water). °? Repeat the sitz bath as often as you would like to relieve perineal pain, itching, or discomfort. °· Apply a numbing spray to the perineal tear site as directed by your health care provider. This may help with discomfort. °· Wash your hands before and after applying medicine to the area. °· Put about 3 witch hazel-containing hemorrhoid treatment pads on top of your sanitary pad. The witch hazel in the hemorrhoid pads helps with discomfort and swelling. °· Get a squeeze bottle to squeeze warm water on your perineum when urinating, spraying the area from front to back. Pat the area to dry it. °· Sitting on an inflatable   ring or pillow may provide comfort.  Take medicines only as directed by your health care provider.  Do not have sexual intercourse or use tampons until your health care provider says it is okay. Typically, you must wait at least 6 weeks.  Keep all postpartum appointments as directed by your health care provider. Contact a health care provider if:  Your pain is not relieved with medicines.  You have painful urination.  You have a fever. Get help right away if:  You have redness, swelling, or increasing pain in the area of the  tear.  You have pus coming from the area of the tear.  You notice a bad smell coming from the area of the tear.  Your tear opens.  You notice swelling in the area of the tear that is larger than when you left the hospital.  You cannot urinate. This information is not intended to replace advice given to you by your health care provider. Make sure you discuss any questions you have with your health care provider. Document Released: 07/09/2013 Document Revised: 08/06/2015 Document Reviewed: 11/28/2012 Elsevier Interactive Patient Education  2017 [place]. Breastfeeding Choosing to breastfeed is one of the best decisions you can make for yourself and your baby. A change in hormones during pregnancy causes your breasts to make breast milk in your milk-producing glands. Hormones prevent breast milk from being released before your baby is born. They also prompt milk flow after birth. Once breastfeeding has begun, thoughts of your baby, as well as his or her sucking or crying, can stimulate the release of milk from your milk-producing glands. Benefits of breastfeeding Research shows that breastfeeding offers many health benefits for infants and mothers. It also offers a cost-free and convenient way to feed your baby. For your baby  Your first milk (colostrum) helps your baby's digestive system to function better.  Special cells in your milk (antibodies) help your baby to fight off infections.  Breastfed babies are less likely to develop asthma, allergies, obesity, or type 2 diabetes. They are also at lower risk for sudden infant death syndrome (SIDS).  Nutrients in breast milk are better able to meet your babys needs compared to infant formula.  Breast milk improves your baby's brain development. For you  Breastfeeding helps to create a very special bond between you and your baby.  Breastfeeding is convenient. Breast milk costs nothing and is always available at the correct  temperature.  Breastfeeding helps to burn calories. It helps you to lose the weight that you gained during pregnancy.  Breastfeeding makes your uterus return faster to its size before pregnancy. It also slows bleeding (lochia) after you give birth.  Breastfeeding helps to lower your risk of developing type 2 diabetes, osteoporosis, rheumatoid arthritis, cardiovascular disease, and breast, ovarian, uterine, and endometrial cancer later in life. Breastfeeding basics Starting breastfeeding  Find a comfortable place to sit or lie down, with your neck and back well-supported.  Place a pillow or a rolled-up blanket under your baby to bring him or her to the level of your breast (if you are seated). Nursing pillows are specially designed to help support your arms and your baby while you breastfeed.  Make sure that your baby's tummy (abdomen) is facing your abdomen.  Gently massage your breast. With your fingertips, massage from the outer edges of your breast inward toward the nipple. This encourages milk flow. If your milk flows slowly, you may need to continue this action during the feeding.  Support  your breast with 4 fingers underneath and your thumb above your nipple (make the letter "C" with your hand). Make sure your fingers are well away from your nipple and your babys mouth.  Stroke your baby's lips gently with your finger or nipple.  When your baby's mouth is open wide enough, quickly bring your baby to your breast, placing your entire nipple and as much of the areola as possible into your baby's mouth. The areola is the colored area around your nipple. ? More areola should be visible above your baby's upper lip than below the lower lip. ? Your baby's lips should be opened and extended outward (flanged) to ensure an adequate, comfortable latch. ? Your baby's tongue should be between his or her lower gum and your breast.  Make sure that your baby's mouth is correctly positioned around  your nipple (latched). Your baby's lips should create a seal on your breast and be turned out (everted).  It is common for your baby to suck about 2-3 minutes in order to start the flow of breast milk. Latching Teaching your baby how to latch onto your breast properly is very important. An improper latch can cause nipple pain, decreased milk supply, and poor weight gain in your baby. Also, if your baby is not latched onto your nipple properly, he or she may swallow some air during feeding. This can make your baby fussy. Burping your baby when you switch breasts during the feeding can help to get rid of the air. However, teaching your baby to latch on properly is still the best way to prevent fussiness from swallowing air while breastfeeding. Signs that your baby has successfully latched onto your nipple  Silent tugging or silent sucking, without causing you pain. Infant's lips should be extended outward (flanged).  Swallowing heard between every 3-4 sucks once your milk has started to flow (after your let-down milk reflex occurs).  Muscle movement above and in front of his or her ears while sucking.  Signs that your baby has not successfully latched onto your nipple  Sucking sounds or smacking sounds from your baby while breastfeeding.  Nipple pain.  If you think your baby has not latched on correctly, slip your finger into the corner of your babys mouth to break the suction and place it between your baby's gums. Attempt to start breastfeeding again. Signs of successful breastfeeding Signs from your baby  Your baby will gradually decrease the number of sucks or will completely stop sucking.  Your baby will fall asleep.  Your baby's body will relax.  Your baby will retain a small amount of milk in his or her mouth.  Your baby will let go of your breast by himself or herself.  Signs from you  Breasts that have increased in firmness, weight, and size 1-3 hours after  feeding.  Breasts that are softer immediately after breastfeeding.  Increased milk volume, as well as a change in milk consistency and color by the fifth day of breastfeeding.  Nipples that are not sore, cracked, or bleeding.  Signs that your baby is getting enough milk  Wetting at least 1-2 diapers during the first 24 hours after birth.  Wetting at least 5-6 diapers every 24 hours for the first week after birth. The urine should be clear or pale yellow by the age of 5 days.  Wetting 6-8 diapers every 24 hours as your baby continues to grow and develop.  At least 3 stools in a 24-hour period by the  age of 5 days. The stool should be soft and yellow.  At least 3 stools in a 24-hour period by the age of 7 days. The stool should be seedy and yellow.  No loss of weight greater than 10% of birth weight during the first 3 days of life.  Average weight gain of 4-7 oz (113-198 g) per week after the age of 4 days.  Consistent daily weight gain by the age of 5 days, without weight loss after the age of 2 weeks. After a feeding, your baby may spit up a small amount of milk. This is normal. Breastfeeding frequency and duration Frequent feeding will help you make more milk and can prevent sore nipples and extremely full breasts (breast engorgement). Breastfeed when you feel the need to reduce the fullness of your breasts or when your baby shows signs of hunger. This is called "breastfeeding on demand." Signs that your baby is hungry include:  Increased alertness, activity, or restlessness.  Movement of the head from side to side.  Opening of the mouth when the corner of the mouth or cheek is stroked (rooting).  Increased sucking sounds, smacking lips, cooing, sighing, or squeaking.  Hand-to-mouth movements and sucking on fingers or hands.  Fussing or crying.  Avoid introducing a pacifier to your baby in the first 4-6 weeks after your baby is born. After this time, you may choose to use a  pacifier. Research has shown that pacifier use during the first year of a baby's life decreases the risk of sudden infant death syndrome (SIDS). Allow your baby to feed on each breast as long as he or she wants. When your baby unlatches or falls asleep while feeding from the first breast, offer the second breast. Because newborns are often sleepy in the first few weeks of life, you may need to awaken your baby to get him or her to feed. Breastfeeding times will vary from baby to baby. However, the following rules can serve as a guide to help you make sure that your baby is properly fed:  Newborns (babies 55 weeks of age or younger) may breastfeed every 1-3 hours.  Newborns should not go without breastfeeding for longer than 3 hours during the day or 5 hours during the night.  You should breastfeed your baby a minimum of 8 times in a 24-hour period.  Breast milk pumping Pumping and storing breast milk allows you to make sure that your baby is exclusively fed your breast milk, even at times when you are unable to breastfeed. This is especially important if you go back to work while you are still breastfeeding, or if you are not able to be present during feedings. Your lactation consultant can help you find a method of pumping that works best for you and give you guidelines about how long it is safe to store breast milk. Caring for your breasts while you breastfeed Nipples can become dry, cracked, and sore while breastfeeding. The following recommendations can help keep your breasts moisturized and healthy:  Avoid using soap on your nipples.  Wear a supportive bra designed especially for nursing. Avoid wearing underwire-style bras or extremely tight bras (sports bras).  Air-dry your nipples for 3-4 minutes after each feeding.  Use only cotton bra pads to absorb leaked breast milk. Leaking of breast milk between feedings is normal.  Use lanolin on your nipples after breastfeeding. Lanolin helps to  maintain your skin's normal moisture barrier. Pure lanolin is not harmful (not toxic) to your  baby. Dennis Bast may also hand express a few drops of breast milk and gently massage that milk into your nipples and allow the milk to air-dry.  In the first few weeks after giving birth, some women experience breast engorgement. Engorgement can make your breasts feel heavy, warm, and tender to the touch. Engorgement peaks within 3-5 days after you give birth. The following recommendations can help to ease engorgement:  Completely empty your breasts while breastfeeding or pumping. You may want to start by applying warm, moist heat (in the shower or with warm, water-soaked hand towels) just before feeding or pumping. This increases circulation and helps the milk flow. If your baby does not completely empty your breasts while breastfeeding, pump any extra milk after he or she is finished.  Apply ice packs to your breasts immediately after breastfeeding or pumping, unless this is too uncomfortable for you. To do this: ? Put ice in a plastic bag. ? Place a towel between your skin and the bag. ? Leave the ice on for 20 minutes, 2-3 times a day.  Make sure that your baby is latched on and positioned properly while breastfeeding.  If engorgement persists after 48 hours of following these recommendations, contact your health care provider or a Science writer. Overall health care recommendations while breastfeeding  Eat 3 healthy meals and 3 snacks every day. Well-nourished mothers who are breastfeeding need an additional 450-500 calories a day. You can meet this requirement by increasing the amount of a balanced diet that you eat.  Drink enough water to keep your urine pale yellow or clear.  Rest often, relax, and continue to take your prenatal vitamins to prevent fatigue, stress, and low vitamin and mineral levels in your body (nutrient deficiencies).  Do not use any products that contain nicotine or tobacco,  such as cigarettes and e-cigarettes. Your baby may be harmed by chemicals from cigarettes that pass into breast milk and exposure to secondhand smoke. If you need help quitting, ask your health care provider.  Avoid alcohol.  Do not use illegal drugs or marijuana.  Talk with your health care provider before taking any medicines. These include over-the-counter and prescription medicines as well as vitamins and herbal supplements. Some medicines that may be harmful to your baby can pass through breast milk.  It is possible to become pregnant while breastfeeding. If birth control is desired, ask your health care provider about options that will be safe while breastfeeding your baby. Where to find more information: [employer]: www.llli.org Contact a health care provider if:  You feel like you want to stop breastfeeding or have become frustrated with breastfeeding.  Your nipples are cracked or bleeding.  Your breasts are red, tender, or warm.  You have: ? Painful breasts or nipples. ? A swollen area on either breast. ? A fever or chills. ? Nausea or vomiting. ? Drainage other than breast milk from your nipples.  Your breasts do not become full before feedings by the fifth day after you give birth.  You feel sad and depressed.  Your baby is: ? Too sleepy to eat well. ? Having trouble sleeping. ? More than 99 week old and wetting fewer than 6 diapers in a 24-hour period. ? Not gaining weight by 30 days of age.  Your baby has fewer than 3 stools in a 24-hour period.  Your baby's skin or the white parts of his or her eyes become yellow. Get help right away if:  Your baby is  overly tired (lethargic) and does not want to wake up and feed.  Your baby develops an unexplained fever. Summary  Breastfeeding offers many health benefits for infant and mothers.  Try to breastfeed your infant when he or she shows early signs of hunger.  Gently tickle or stroke your  baby's lips with your finger or nipple to allow the baby to open his or her mouth. Bring the baby to your breast. Make sure that much of the areola is in your baby's mouth. Offer one side and burp the baby before you offer the other side.  Talk with your health care provider or lactation consultant if you have questions or you face problems as you breastfeed. This information is not intended to replace advice given to you by your health care provider. Make sure you discuss any questions you have with your health care provider. Document Released: 02/22/2005 Document Revised: 03/26/2016 Document Reviewed: 03/26/2016 Elsevier Interactive Patient Education  2018 [place] Instructions for Mom ACTIVITY  Gradually return to your regular activities.  Let yourself rest. Nap while your baby sleeps.  Avoid lifting anything that is heavier than 10 lb (4.5 kg) until your health care provider says it is okay.  Avoid activities that take a lot of effort and energy (are strenuous) until approved by your health care provider. Walking at a slow-to-moderate pace is usually safe.  If you had a cesarean delivery: ? Do not vacuum, climb stairs, or drive a car for 4-6 weeks. ? Have someone help you at home until you feel like you can do your usual activities yourself. ? Do exercises as told by your health care provider, if this applies.  VAGINAL BLEEDING You may continue to bleed for 4-6 weeks after delivery. Over time, the amount of blood usually decreases and the color of the blood usually gets lighter. However, the flow of bright red blood may increase if you have been too active. If you need to use more than one pad in an hour because your pad gets soaked, or if you pass a large clot:  Lie down.  Raise your feet.  Place a cold compress on your lower abdomen.  Rest.  Call your health care provider.  If you are breastfeeding, your period should return anytime between 8 weeks after  delivery and the time that you stop breastfeeding. If you are not breastfeeding, your period should return 6-8 weeks after delivery. PERINEAL CARE The perineal area, or perineum, is the part of your body between your thighs. After delivery, this area needs special care. Follow these instructions as told by your health care provider.  Take warm tub baths for 15-20 minutes.  Use medicated pads and pain-relieving sprays and creams as told.  Do not use tampons or douches until vaginal bleeding has stopped.  Each time you go to the bathroom: ? Use a peri bottle. ? Change your pad. ? Use towelettes in place of toilet paper until your stitches have healed.  Do Kegel exercises every day. Kegel exercises help to maintain the muscles that support the vagina, bladder, and bowels. You can do these exercises while you are standing, sitting, or lying down. To do Kegel exercises: ? Tighten the muscles of your abdomen and the muscles that surround your birth canal. ? Hold for a few seconds. ? Relax. ? Repeat until you have done this 5 times in a row.  To prevent hemorrhoids from developing or getting worse: ? Drink enough fluid to keep your  urine clear or pale yellow. ? Avoid straining when having a bowel movement. ? Take over-the-counter medicines and stool softeners as told by your health care provider.  BREAST CARE  Wear a tight-fitting bra.  Avoid taking over-the-counter pain medicine for breast discomfort.  Apply ice to the breasts to help with discomfort as needed: ? Put ice in a plastic bag. ? Place a towel between your skin and the bag. ? Leave the ice on for 20 minutes or as told by your health care provider.  NUTRITION  Eat a well-balanced diet.  Do not try to lose weight quickly by cutting back on calories.  Take your prenatal vitamins until your postpartum checkup or until your health care provider tells you to stop.  POSTPARTUM DEPRESSION You may find yourself crying for no  apparent reason and unable to cope with all of the changes that come with having a newborn. This mood is called postpartum depression. Postpartum depression happens because your hormone levels change after delivery. If you have postpartum depression, get support from your partner, friends, and family. If the depression does not go away on its own after several weeks, contact your health care provider. BREAST SELF-EXAM Do a breast self-exam each month, at the same time of the month. If you are breastfeeding, check your breasts just after a feeding, when your breasts are less full. If you are breastfeeding and your period has started, check your breasts on day 5, 6, or 7 of your period. Report any lumps, bumps, or discharge to your health care provider. Know that breasts are normally lumpy if you are breastfeeding. This is temporary, and it is not a health risk. INTIMACY AND SEXUALITY Avoid sexual activity for at least 3-4 weeks after delivery or until the brownish-red vaginal flow is completely gone. If you want to avoid pregnancy, use some form of birth control. You can get pregnant after delivery, even if you have not had your period. SEEK MEDICAL CARE IF:  You feel unable to cope with the changes that a child brings to your life, and these feelings do not go away after several weeks.  You notice a lump, a bump, or discharge on your breast.  SEEK IMMEDIATE MEDICAL CARE IF:  Blood soaks your pad in 1 hour or less.  You have: ? Severe pain or cramping in your lower abdomen. ? A bad-smelling vaginal discharge. ? A fever that is not controlled by medicine. ? A fever, and an area of your breast is red and sore. ? Pain or redness in your calf. ? Sudden, severe chest pain. ? Shortness of breath. ? Painful or bloody urination. ? Problems with your vision.  You vomit for 12 hours or longer.  You develop a severe headache.  You have serious thoughts about hurting yourself, your child, or anyone  else.  This information is not intended to replace advice given to you by your health care provider. Make sure you discuss any questions you have with your health care provider. Document Released: 02/20/2000 Document Revised: 07/31/2015 Document Reviewed: 08/26/2014 Elsevier Interactive Patient Education  2017 [place]. Postpartum Care After Vaginal Delivery The period of time right after you deliver your newborn is called the postpartum period. What kind of medical care will I receive?  You may continue to receive fluids and medicines through an IV tube inserted into one of your veins.  If an incision was made near your vagina (episiotomy) or if you had some vaginal tearing during delivery, cold  compresses may be placed on your episiotomy or your tear. This helps to reduce pain and swelling.  You may be given a squirt bottle to use when you go to the bathroom. You may use this until you are comfortable wiping as usual. To use the squirt bottle, follow these steps: ? Before you urinate, fill the squirt bottle with warm water. Do not use hot water. ? After you urinate, while you are sitting on the toilet, use the squirt bottle to rinse the area around your urethra and vaginal opening. This rinses away any urine and blood. ? You may do this instead of wiping. As you start healing, you may use the squirt bottle before wiping yourself. Make sure to wipe gently. ? Fill the squirt bottle with clean water every time you use the bathroom.  You will be given sanitary pads to wear. How can I expect to feel?  You may not feel the need to urinate for several hours after delivery.  You will have some soreness and pain in your abdomen and vagina.  If you are breastfeeding, you may have uterine contractions every time you breastfeed for up to several weeks postpartum. Uterine contractions help your uterus return to its normal size.  It is normal to have vaginal bleeding (lochia) after delivery. The  amount and appearance of lochia is often similar to a menstrual period in the first week after delivery. It will gradually decrease over the next few weeks to a dry, yellow-brown discharge. For most women, lochia stops completely by 6-8 weeks after delivery. Vaginal bleeding can vary from woman to woman.  Within the first few days after delivery, you may have breast engorgement. This is when your breasts feel heavy, full, and uncomfortable. Your breasts may also throb and feel hard, tightly stretched, warm, and tender. After this occurs, you may have milk leaking from your breasts.Your health care provider can help you relieve discomfort due to breast engorgement. Breast engorgement should go away within a few days.  You may feel more sad or worried than normal due to hormonal changes after delivery. These feelings should not last more than a few days. If these feelings do not go away after several days, speak with your health care provider. How should I care for myself?  Tell your health care provider if you have pain or discomfort.  Drink enough water to keep your urine clear or pale yellow.  Wash your hands thoroughly with soap and water for at least 20 seconds after changing your sanitary pads, after using the toilet, and before holding or feeding your baby.  If you are not breastfeeding, avoid touching your breasts a lot. Doing this can make your breasts produce more milk.  If you become weak or lightheaded, or you feel like you might faint, ask for help before: ? Getting out of bed. ? Showering.  Change your sanitary pads frequently. Watch for any changes in your flow, such as a sudden increase in volume, a change in color, the passing of large blood clots. If you pass a blood clot from your vagina, save it to show to your health care provider. Do not flush blood clots down the toilet without having your health care provider look at them.  Make sure that all your vaccinations are up to  date. This can help protect you and your baby from getting certain diseases. You may need to have immunizations done before you leave the hospital.  If desired, talk with your health  care provider about methods of family planning or birth control (contraception). How can I start bonding with my baby? Spending as much time as possible with your baby is very important. During this time, you and your baby can get to know each other and develop a bond. Having your baby stay with you in your room (rooming in) can give you time to get to know your baby. Rooming in can also help you become comfortable caring for your baby. Breastfeeding can also help you bond with your baby. How can I plan for returning home with my baby?  Make sure that you have a car seat installed in your vehicle. ? Your car seat should be checked by a certified car seat installer to make sure that it is installed safely. ? Make sure that your baby fits into the car seat safely.  Ask your health care provider any questions you have about caring for yourself or your baby. Make sure that you are able to contact your health care provider with any questions after leaving the hospital. This information is not intended to replace advice given to you by your health care provider. Make sure you discuss any questions you have with your health care provider. Document Released: 12/20/2006 Document Revised: 07/28/2015 Document Reviewed: 01/27/2015 Elsevier Interactive Patient Education  2018 [employer]. Postpartum Depression and Baby Blues The postpartum period begins right after the birth of a baby. During this time, there is often a great amount of joy and excitement. It is also a time of many changes in the life of the parents. Regardless of how many times a mother gives birth, each child brings new challenges and dynamics to the family. It is not unusual to have feelings of excitement along with confusing shifts in moods, emotions, and  thoughts. All mothers are at risk of developing postpartum depression or the "baby blues." These mood changes can occur right after giving birth, or they may occur many months after giving birth. The baby blues or postpartum depression can be mild or severe. Additionally, postpartum depression can go away rather quickly, or it can be a long-term condition. What are the causes? Raised hormone levels and the rapid drop in those levels are thought to be a main cause of postpartum depression and the baby blues. A number of hormones change during and after pregnancy. Estrogen and progesterone usually decrease right after the delivery of your baby. The levels of thyroid hormone and various cortisol steroids also rapidly drop. Other factors that play a role in these mood changes include major life events and genetics. What increases the risk? If you have any of the following risks for the baby blues or postpartum depression, know what symptoms to watch out for during the postpartum period. Risk factors that may increase the likelihood of getting the baby blues or postpartum depression include:  Having a personal or family history of depression.  Having depression while being pregnant.  Having premenstrual mood issues or mood issues related to oral contraceptives.  Having a lot of life stress.  Having marital conflict.  Lacking a social support network.  Having a baby with special needs.  Having health problems, such as diabetes.  What are the signs or symptoms? Symptoms of baby blues include:  Brief changes in mood, such as going from extreme happiness to sadness.  Decreased concentration.  Difficulty sleeping.  Crying spells, tearfulness.  Irritability.  Anxiety.  Symptoms of postpartum depression typically begin within the first month after giving birth.  These symptoms include:  Difficulty sleeping or excessive sleepiness.  Marked weight loss.  Agitation.  Feelings of  worthlessness.  Lack of interest in activity or food.  Postpartum psychosis is a very serious condition and can be dangerous. Fortunately, it is rare. Displaying any of the following symptoms is cause for immediate medical attention. Symptoms of postpartum psychosis include:  Hallucinations and delusions.  Bizarre or disorganized behavior.  Confusion or disorientation.  How is this diagnosed? A diagnosis is made by an evaluation of your symptoms. There are no medical or lab tests that lead to a diagnosis, but there are various questionnaires that a health care provider may use to identify those with the baby blues, postpartum depression, or psychosis. Often, a screening tool called the [place] Postnatal Depression Scale is used to diagnose depression in the postpartum period. How is this treated? The baby blues usually goes away on its own in 1-2 weeks. Social support is often all that is needed. You will be encouraged to get adequate sleep and rest. Occasionally, you may be given medicines to help you sleep. Postpartum depression requires treatment because it can last several months or longer if it is not treated. Treatment may include individual or group therapy, medicine, or both to address any social, physiological, and psychological factors that may play a role in the depression. Regular exercise, a healthy diet, rest, and social support may also be strongly recommended. Postpartum psychosis is more serious and needs treatment right away. Hospitalization is often needed. Follow these instructions at home:  Get as much rest as you can. Nap when the baby sleeps.  Exercise regularly. Some women find yoga and walking to be beneficial.  Eat a balanced and nourishing diet.  Do little things that you enjoy. Have a cup of tea, take a bubble bath, read your favorite magazine, or listen to your favorite music.  Avoid alcohol.  Ask for help with household chores, cooking, grocery shopping,  or running errands as needed. Do not try to do everything.  Talk to people close to you about how you are feeling. Get support from your partner, family members, friends, or other new moms.  Try to stay positive in how you think. Think about the things you are grateful for.  Do not spend a lot of time alone.  Only take over-the-counter or prescription medicine as directed by your health care provider.  Keep all your postpartum appointments.  Let your health care provider know if you have any concerns. Contact a health care provider if: You are having a reaction to or problems with your medicine. Get help right away if:  You have suicidal feelings.  You think you may harm the baby or someone else. This information is not intended to replace advice given to you by your health care provider. Make sure you discuss any questions you have with your health care provider. Document Released: 11/27/2003 Document Revised: 07/31/2015 Document Reviewed: 12/04/2012 Elsevier Interactive Patient Education  2017 [employer].

## 2017-12-09 ENCOUNTER — Other Ambulatory Visit: Payer: Self-pay | Admitting: Certified Nurse Midwife

## 2017-12-11 ENCOUNTER — Other Ambulatory Visit: Payer: Self-pay | Admitting: Certified Nurse Midwife

## 2017-12-13 ENCOUNTER — Ambulatory Visit: Payer: Self-pay

## 2017-12-13 NOTE — Lactation Note (Signed)
This note was copied from a baby's chart. 12/13/2017  Name: Amy Yang MRN: [id number] Date of Birth: 11/15/2015 Gestational Age: Gestational Age: [redacted]w[redacted]d Birth Weight: 121.2 oz Weight today:     10 pounds 2.2 ounces (4598 grams) with clean newborn diaper  Infant presents today with mom for feeding assessment. Mom is concerned with oversupply and fast letdown.   Infant has gained 1448 grams in the last 26 days with an average daily weight gain of 56 grams a day.   Mom reports infant feeds about every 2-3 hours for about 6 minutes per breast. Infant tends to be gaggy and spitty per mom with feedings. Infant is choking on the breast at times. Mom pulls infant off the breast as needed. Reviewed positioning and pre pumping a small amount prior to latching to help with milk flow for infant. Mom has does some prepumping and comfort pumping as needed. Infant was noted to have some moderate spit ups of milk with feeding.   Mom reports she was told to feed infant 10 minutes on each breast. Enc mom to emptying first breast before offering second breast. Reviewed comfort pumping only for now and prepumping as needed. Reviewed postioning infant in a laid back position. Reviewed that it is not recommended to try and decrease a milk supply prior to 6 weeks when the breast milk supply typically down regulates. Gave mom printed handout from [email or web address] " Forceful Letdown (Milk Ejection Reflex) and Oversupply".  Mom has 50-60 ounces stored in the freezer.   Infant with subtle posterior lingual frenulum that does not seem to be effecting milk supply or transfer at this time. Discussed with mom that the milk supply transfer can sometimes be effected once milk supply down regulates and infant has to work harder to feed. Discussed importance of following infant weight closely. Mom aware of OP Services, Peds office and BF Support Groups and places for weight checks. Mom to talk with Dr. Tama High at her appt on  Thursday.  Infant fed on both breasts and fed well. He did have some spit ups noted with burping. Infant fell asleep after being offered the second breast.   Infant to follow up with Dr. Tama High on 10/10. Mom aware of BF Support Groups. Infant to follow up with Lactation as needed. Enc mom to call if she is still having difficulty within the next 2 weeks.     General Information: Mother's reason for visit: concerns with oversupply Consult: Initial Lactation consultant: Noralee Stain RN,IBCLC Breastfeeding experience: going well. concerns with over supply or forceful letdown Maternal medical conditions: Pregnancy induced hypertension Maternal medications: Other, Pre-natal vitamin(Labetalol)  Breastfeeding History: Frequency of breast feeding: every 1-3 hours Duration of feeding: 6-10 minutes each side  Supplementation:                 Pump type: Spectra Pump frequency: comfort pumping BID Pump volume: 1/2-1.5 ounces  Infant Output Assessment: Voids per 24 hours: 8+ Urine color: Clear yellow Stools per 24 hours: 6+ Stool color: Yellow  Breast Assessment: Breast: Filling, Compressible Nipple: Erect Pain level: 0 Pain interventions: Bra  Feeding Assessment: Infant oral assessment: Variance Infant oral assessment comment: Infant with thick labial frenulum, upper lip is tight with flanging and on the breast, infant with blanching to upper lip with flanging. infant with strong suckle on gloved finger. infant with good tongue extension and lateralization. infant with some decreased mid tongue elevation. Gave mom handout on tongue and lip restrictions and cautioned about following  infant growth and milk supply closely. Discussed how tongue and lip restrictions may effect milk supply and infant transfer once milk supply self regulates.   Positioning: Cross cradle(right breast) Latch: 2 - Grasps breast easily, tongue down, lips flanged, rhythmical sucking. Audible swallowing: 2  - Spontaneous and intermittent Type of nipple: 2 - Everted at rest and after stimulation Comfort: 2 - Soft/non-tender Hold: 2 - No assistance needed to correctly position infant at breast LATCH score: 10 Latch assessment: Shallow Lips flanged: No(upper lip needs flanging) Suck assessment: Nutritive   Pre-feed weight: 4598 grams Post feed weight: 4686 grams Amount transferred: 88 ml Amount supplemented: 0  Additional Feeding Assessment: Infant oral assessment: Variance Infant oral assessment comment: see above Positioning: Cross cradle(left breast) Latch: 2 - Grasps breast easily, tongue down, lips flanged, rhythmical sucking. Audible swallowing: 2 - Spontaneous and intermittent Type of nipple: 2 - Everted at rest and after stimulation Comfort: 2 - Soft/non-tender Hold: 2 - No assistance needed to correctly position infant at breast LATCH score: 10 Latch assessment: Shallow Lips flanged: No(upper lip needs flanging) Suck assessment: Displays both   Pre-feed weight: did not reweigh, feeding was after diaper change and redressing, breast softened wtih feeding.          Totals: Total amount transferred: 88 + Total supplement given: 0 Total amount pumped post feed: did not pump   Plan:  1. Offer infant the breast with feeding cues 2. Empty first breast before offering second breast, it is ok to use one breast per feeding unless infant wants second breast 3. Pump off about 1/2 ounce prior to latching infant to the breast at times when breasts are really full and infant noted to choke and gag on the breast 4. If not pumping prior to latch, allow infant to pull of if needed and allow milk to run into a towel 5. Burp infant frequently with feeding 6. Keep infant upright after breast feeding for 20-30 minutes post feeding 7. Pump second breast for comfort if needed 8. Infant needs about 84-113 ml (3-3.75 ounces) for 8 feedings a day or 675-900 ml (23-30 ml) in 24 hours. Infant may  take more or less depending on how often he feeds.  9. It is recommended that infants receive a bottle between 4-6 weeks if mom is wanting to give bottles 10. Use a slow flow newborn nipples for bottle feeding 11. Feed infant using paced bottle feeding method (video on [email or web address]) 12. Keep up the good work  13. Call with any questions/concerns as needed [phone] 14. Thank you for allowing me to assist you today.  15. Follow up with Lactation as needed   Ed Blalock RN, IBCLC                                                      Amy Yang 12/13/2017, 11:33 AM

## 2018-01-19 ENCOUNTER — Encounter (HOSPITAL_COMMUNITY): Payer: Self-pay | Admitting: Emergency Medicine

## 2018-01-19 ENCOUNTER — Emergency Department (HOSPITAL_COMMUNITY)
Admission: EM | Admit: 2018-01-19 | Discharge: 2018-01-20 | Disposition: A | Discharge status: Home / Self Care | Payer: 59 | Attending: Emergency Medicine | Admitting: Emergency Medicine

## 2018-01-19 DIAGNOSIS — R51 Headache: Secondary | ICD-10-CM | POA: Diagnosis present

## 2018-01-19 DIAGNOSIS — G43909 Migraine, unspecified, not intractable, without status migrainosus: Secondary | ICD-10-CM | POA: Diagnosis not present

## 2018-01-19 DIAGNOSIS — G43009 Migraine without aura, not intractable, without status migrainosus: Secondary | ICD-10-CM

## 2018-01-19 DIAGNOSIS — Z79899 Other long term (current) drug therapy: Secondary | ICD-10-CM | POA: Insufficient documentation

## 2018-01-19 MED ORDER — METOCLOPRAMIDE HCL 5 MG/ML IJ SOLN
10.0000 mg | Freq: Once | INTRAMUSCULAR | Status: AC
  Administered 2018-01-19: 10 mg via INTRAVENOUS
  Filled 2018-01-19: qty 2

## 2018-01-19 MED ORDER — DEXAMETHASONE SODIUM PHOSPHATE 10 MG/ML IJ SOLN
10.0000 mg | Freq: Once | INTRAMUSCULAR | Status: AC
  Administered 2018-01-19: 10 mg via INTRAVENOUS
  Filled 2018-01-19: qty 1

## 2018-01-19 MED ORDER — ONDANSETRON 4 MG PO TBDP
4.0000 mg | ORAL_TABLET | Freq: Once | ORAL | Status: AC
  Administered 2018-01-19: 4 mg via ORAL
  Filled 2018-01-19: qty 1

## 2018-01-19 MED ORDER — DIPHENHYDRAMINE HCL 50 MG/ML IJ SOLN
12.5000 mg | Freq: Once | INTRAMUSCULAR | Status: AC
  Administered 2018-01-19: 12.5 mg via INTRAVENOUS
  Filled 2018-01-19: qty 1

## 2018-01-19 MED ORDER — ONDANSETRON HCL 4 MG/2ML IJ SOLN: 4.0000 mg | Freq: Once | INTRAMUSCULAR | Status: DC | PRN

## 2018-01-19 NOTE — ED Triage Notes (Signed)
Pt reports migraine onset this AM. N/V, photosensitivity. States hx of migraines, is currently nursing so unable to take her normal migraine meds.

## 2018-01-19 NOTE — ED Provider Notes (Signed)
[facility] EMERGENCY DEPARTMENT Provider Note   CSN: [id number] Arrival date & time: 01/19/18  2238     History   Chief Complaint Chief Complaint  Patient presents with  . Migraine    HPI Amy Yang is a 42 y.o. female.  Patient with a history of frequent migraine headaches presents with severe migraine, associated with nausea, vomiting. She is approximately 8 weeks post-partum, nursing, and reports difficulty managing her headaches throughout the pregnancy. She took tylenol #3 on waking this morning but this did not provide relief. She denies current sinus pressure or congestion and also that she is completing a Z-pack (last dose tomorrow) for URI symptoms. No fever. This is typical of her migraines.  The history is provided by the patient. No language interpreter was used.  Migraine  Associated symptoms include headaches. Pertinent negatives include no abdominal pain.    Past Medical History:  Diagnosis Date  . Bronchitis   . Encounter for infertility   . Kidney stones   . Kidney stones   . Migraine   . Pneumonia   . Preterm labor     Patient Active Problem List   Diagnosis Date Noted  . PP SVD (9/10) PEC 11/17/2017  . Preeclampsia 11/17/2017  . Indication for care in labor or delivery 11/14/2017    Past Surgical History:  Procedure Laterality Date  . KIDNEY STONE SURGERY    . WISDOM TOOTH EXTRACTION       OB History    Gravida  2   Para  2   Term  2   Preterm  0   AB  0   Living  2     SAB  0   TAB  0   Ectopic  0   Multiple  0   Live Births  2            Home Medications    Prior to Admission medications   Medication Sig Start Date End Date Taking? Authorizing Provider  dibucaine (NUPERCAINAL) 1 % OINT Place 1 application rectally as needed for hemorrhoids. 11/19/17   Lawhorn, Vanessa [place], CNM  diphenhydrAMINE (BENADRYL ALLERGY) 25 MG tablet Take 25 mg by mouth daily as needed (unk).     [provider]  ibuprofen (ADVIL,MOTRIN) 600 MG tablet Take 1 tablet (600 mg total) by mouth every 6 (six) hours. 11/19/17   Gunnar Bulla, CNM  iron polysaccharides (NIFEREX) 150 MG capsule Take 1 capsule (150 mg total) by mouth daily. 11/20/17   Lawhorn, Vanessa [place], CNM  labetalol (NORMODYNE) 300 MG tablet Take 1 tablet (300 mg total) by mouth every 8 (eight) hours. 11/19/17 12/19/17  Gunnar Bulla, CNM  NIFEdipine (PROCARDIA-XL/ADALAT CC) 30 MG 24 hr tablet Take 1 tablet (30 mg total) by mouth every 12 (twelve) hours. 11/19/17   Lawhorn, Vanessa [place], CNM  Omega-3 Fatty Acids (FISH OIL PO) Take 1 capsule by mouth daily.     [provider]  oxyCODONE-acetaminophen (PERCOCET/ROXICET) 5-325 MG tablet Take 2 tablets by mouth every 4 (four) hours as needed (pain scale > 7). 11/19/17   Gunnar Bulla, CNM  Prenatal Vit-Fe Fumarate-FA (PRENATAL VITAMIN PO) Take 1 tablet by mouth daily.     [provider]  valACYclovir (VALTREX) 1000 MG tablet Take 2 g by mouth 2 (two) times daily as needed (cold sores). x2 days. When there is an Producer, television/film/video, Historical, MD  witch hazel-glycerin (TUCKS) pad Apply 1 application  topically as needed for hemorrhoids. 11/19/17   Lawhorn, Vanessa [place], CNM    Family History Family History  Problem Relation Age of Onset  . Heart disease Father   . Asthma Sister   . Cancer Maternal Grandmother   . Cancer Maternal Grandfather   . Cancer Paternal Grandmother   . Diabetes Paternal Grandmother   . Stroke Paternal Grandfather     Social History Social History   Tobacco Use  . Smoking status: Never Smoker  . Smokeless tobacco: Never Used  Substance Use Topics  . Alcohol use: Yes    Comment: occasionally; socially  . Drug use: No     Allergies   Latex; Morphine and related; Pineapple; Rhinocort [budesonide]; and Symbicort [budesonide-formoterol fumarate]   Review of Systems Review of  Systems  Constitutional: Negative for chills and fever.  HENT: Positive for facial swelling (Has noticed periorbital puffiness today). Negative for congestion.   Eyes: Positive for photophobia.  Respiratory: Negative.   Cardiovascular: Negative.   Gastrointestinal: Positive for nausea and vomiting. Negative for abdominal pain.  Musculoskeletal: Negative.   Skin: Negative.   Neurological: Positive for headaches.     Physical Exam Updated Vital Signs BP (!) 138/101 (BP Location: Right Arm)   Pulse 68   Temp 97.9 F (36.6 C) (Oral)   Resp 18   Ht 5\' 7"  (1.702 m)   Wt 68 kg   SpO2 100%   BMI 23.49 kg/m   Physical Exam  Constitutional: She is oriented to person, place, and time. She appears well-developed and well-nourished.  HENT:  Head: Normocephalic.  Eyes: Pupils are equal, round, and reactive to light.  Neck: Normal range of motion. Neck supple.  Cardiovascular: Normal rate and regular rhythm.  No murmur heard. Pulmonary/Chest: Effort normal and breath sounds normal. She has no wheezes. She has no rales.  Abdominal: Soft. Bowel sounds are normal. There is no tenderness. There is no rebound and no guarding.  Musculoskeletal: Normal range of motion.  Neurological: She is alert and oriented to person, place, and time. She displays normal reflexes. No sensory deficit. Coordination normal.  Initial exam limited by pain. Normal and symmetric sensation. No mental status changes or deficits. Speech clear and focused. Coordination is normal. Reflexes LE present and symmetric.  Skin: Skin is warm and dry. No rash noted.  Psychiatric: She has a normal mood and affect.     ED Treatments / Results  Labs (all labs ordered are listed, but only abnormal results are displayed) Labs Reviewed - No data to display  EKG None Radiology No results found.  Procedures Procedures (including critical care time)  Medications Ordered in ED Medications  diphenhydrAMINE (BENADRYL)  injection 12.5 mg (has no administration in time range)  metoCLOPramide (REGLAN) injection 10 mg (has no administration in time range)  dexamethasone (DECADRON) injection 10 mg (has no administration in time range)  ondansetron (ZOFRAN-ODT) disintegrating tablet 4 mg (4 mg Oral Given 01/19/18 2250)     Initial Impression / Assessment and Plan / ED Course  I have reviewed the triage vital signs and the nursing notes.  Pertinent labs & imaging results that were available during my care of the patient were reviewed by me and considered in my medical decision making (see chart for details).     Serial exams - have been unchanged in patient that presented with typical migraine symptoms.   All medications provided were reviewed with pharmacy and determined to be safe in lactation.  Recheck - the patient  is feeling much better and states she is ready to be discharged home. VSS. Mild hypertention. She has a neurologist for outpatient follow up and is encouraged to make an appointment.   Final Clinical Impressions(s) / ED Diagnoses   Final diagnoses:  None   1. Migraine headache  ED Discharge Orders    None       Elpidio AnisUpstill, Saajan Willmon, PA-C 01/20/18 0104    Mesner, Barbara CowerJason, MD 01/20/18 [phone]

## 2018-01-20 MED ORDER — KETOROLAC TROMETHAMINE 30 MG/ML IJ SOLN
15.0000 mg | Freq: Once | INTRAMUSCULAR | Status: AC
  Administered 2018-01-20: 15 mg via INTRAVENOUS
  Filled 2018-01-20: qty 1

## 2018-01-20 MED ORDER — METOCLOPRAMIDE HCL 5 MG PO TABS: 5.0000 mg | ORAL_TABLET | Freq: Four times a day (QID) | ORAL | 0 refills | Status: DC | PRN

## 2018-01-20 NOTE — Discharge Instructions (Signed)
Follow up with your neurologist for further management of more frequent migraines.

## 2018-04-13 NOTE — Progress Notes (Signed)
NEUROLOGY FOLLOW UP OFFICE NOTE  Amy Yang [id number]  HISTORY OF PRESENT ILLNESS: Amy Yang is a 43 year old Caucasian woman who follows up for migraines.  ED note reviewed.  I last saw her in consultation in February 2018 for chronic migraines.  At that time, she was trying to get pregnant so treatment options were limited.  She was advised to start magnesium citrate and riboflavin with 3 month follow-up.  I have not seen her since that visit.  She subsequently did become pregnant and delivered her son 5 months ago.  She had uncontrolled headaches during her pregnancy and persisted after giving birth.  She was seen in the ED on 01/19/18 for her headaches.  She is currently breastfeeding.  Still has persistent daily head pressure. Intensity:  6/10 Duration:  3-4 days Frequency: at least once a week (approximately 15-16 headache days total) Frequency of abortive medication: 2 tablets of Fioricet 2 days a week. Current NSAIDS:  none Current analgesics:  Fioricet Current triptans:  none Current anti-emetic:  none Current muscle relaxants:  none Current anti-anxiolytic:  none Current sleep aide:  none Current Antihypertensive medications:  none Current Antidepressant medications:  none Current Anticonvulsant medications:  none Current anti-CGRP:  None Current Vitamins/Herbal/Supplements:  Magnesium, vitamin B6 Current Antihistamines/Decongestants:  Benadryl Other therapy:  Acupuncture  Caffeine:  Highly sensitive to caffeine and headache trigger.  Alcohol:  no Smoker:  no Diet:  Needs to increase water intake Exercise:  routine Depression/anxiety:  okay Sleep hygiene:  ok.  Son sleeping through night.  HISTORY: Onset:  Since 5th grade Location:  Either side Quality:  Throbbing/stabbing Initial Intensity:  8/10 Aura:  no Prodrome:  no Associated symptoms:  Nausea, photophobia.  Some blurred vision.  No vomiting.  She has not had any new worse headache of her life,  waking up from sleep Initial Duration:  Up to 48 hours Initial Frequency:  6 days a month of severe migraine but has daily headache Initial Frequency of abortive medication: only as needed. Triggers:  Change in weather, hormonal/menstrual cycle, caffeine Relieving factors:  Massage, ice, Tylenol, Bendaryl Activity: aggravates headache.  Needs to lay down for severe migraines. She does have daily bi-temporal non-throbbing vice-like headache as well.  Past NSAIDS:  Ibuprofen, naproxen Past analgesics:  Tramadol, Fioricet Past abortive triptans:  Sumatriptan (tablet/NS/[place]), Relpax, Zomig.  Treximet effective. Past antihypertensive medications: labetolol (low blood pressure) Past antidepressant medications:  Nortriptyline, venlafaxine Past anticonvulsant medications:  Topiramate 100mg  (effective but side effects) Past vitamins/Herbal/Supplements:  no Other past therapies:  Botox (effective), trigger point injections, biofeedback  Family history of headache:  No  PAST MEDICAL HISTORY: Past Medical History:  Diagnosis Date  . Bronchitis   . Encounter for infertility   . Kidney stones   . Kidney stones   . Migraine   . Pneumonia   . Preterm labor     MEDICATIONS: Current Outpatient Medications on File Prior to Visit  Medication Sig Dispense Refill  . dibucaine (NUPERCAINAL) 1 % OINT Place 1 application rectally as needed for hemorrhoids. (Patient not taking: Reported on 01/20/2018) 1 Tube 0  . ibuprofen (ADVIL,MOTRIN) 600 MG tablet Take 1 tablet (600 mg total) by mouth every 6 (six) hours. (Patient not taking: Reported on 01/20/2018) 30 tablet 0  . iron polysaccharides (NIFEREX) 150 MG capsule Take 1 capsule (150 mg total) by mouth daily. (Patient not taking: Reported on 01/20/2018) 30 capsule 1  . labetalol (NORMODYNE) 300 MG tablet Take 1 tablet (300  mg total) by mouth every 8 (eight) hours. (Patient not taking: Reported on 01/20/2018) 90 tablet 0  . metoCLOPramide (REGLAN) 5 MG  tablet Take 1 tablet (5 mg total) by mouth every 6 (six) hours as needed for nausea. 10 tablet 0  . NIFEdipine (PROCARDIA-XL/ADALAT CC) 30 MG 24 hr tablet Take 1 tablet (30 mg total) by mouth every 12 (twelve) hours. (Patient not taking: Reported on 01/20/2018) 60 tablet 0  . oxyCODONE-acetaminophen (PERCOCET/ROXICET) 5-325 MG tablet Take 2 tablets by mouth every 4 (four) hours as needed (pain scale > 7). (Patient not taking: Reported on 01/20/2018) 30 tablet 0  . Prenatal Vit-Fe Fumarate-FA (PRENATAL VITAMIN PO) Take 1 tablet by mouth daily.     Marland Kitchen. witch hazel-glycerin (TUCKS) pad Apply 1 application topically as needed for hemorrhoids. (Patient not taking: Reported on 01/20/2018) 40 each 12   No current facility-administered medications on file prior to visit.     ALLERGIES: Allergies  Allergen Reactions  . Latex   . Morphine And Related Nausea And Vomiting  . Pineapple   . Rhinocort [Budesonide]   . Symbicort [Budesonide-Formoterol Fumarate] Swelling    FAMILY HISTORY: Family History  Problem Relation Age of Onset  . Heart disease Father   . Asthma Sister   . Cancer Maternal Grandmother   . Cancer Maternal Grandfather   . Cancer Paternal Grandmother   . Diabetes Paternal Grandmother   . Stroke Paternal Grandfather    SOCIAL HISTORY: Social History   Socioeconomic History  . Marital status: Married    Spouse name: Not on file  . Number of children: Not on file  . Years of education: Not on file  . Highest education level: Not on file  Occupational History  . Not on file  Social Needs  . Financial resource strain: Not on file  . Food insecurity:    Worry: Not on file    Inability: Not on file  . Transportation needs:    Medical: Not on file    Non-medical: Not on file  Tobacco Use  . Smoking status: Never Smoker  . Smokeless tobacco: Never Used  Substance and Sexual Activity  . Alcohol use: Yes    Comment: occasionally; socially  . Drug use: No  . Sexual  activity: Yes  Lifestyle  . Physical activity:    Days per week: Not on file    Minutes per session: Not on file  . Stress: Not on file  Relationships  . Social connections:    Talks on phone: Not on file    Gets together: Not on file    Attends religious service: Not on file    Active member of club or organization: Not on file    Attends meetings of clubs or organizations: Not on file    Relationship status: Not on file  . Intimate partner violence:    Fear of current or ex partner: Not on file    Emotionally abused: Not on file    Physically abused: Not on file    Forced sexual activity: Not on file  Other Topics Concern  . Not on file  Social History Narrative  . Not on file    REVIEW OF SYSTEMS: Constitutional: No fevers, chills, or sweats, no generalized fatigue, change in appetite Eyes: No visual changes, double vision, eye pain Ear, nose and throat: No hearing loss, ear pain, nasal congestion, sore throat Cardiovascular: No chest pain, palpitations Respiratory:  No shortness of breath at rest or with exertion,  wheezes GastrointestinaI: No nausea, vomiting, diarrhea, abdominal pain, fecal incontinence Genitourinary:  No dysuria, urinary retention or frequency Musculoskeletal:  No neck pain, back pain Integumentary: No rash, pruritus, skin lesions Neurological: as above Psychiatric: No depression, insomnia, anxiety Endocrine: No palpitations, fatigue, diaphoresis, mood swings, change in appetite, change in weight, increased thirst Hematologic/Lymphatic:  No purpura, petechiae. Allergic/Immunologic: no itchy/runny eyes, nasal congestion, recent allergic reactions, rashes  PHYSICAL EXAM: Blood pressure 114/72, pulse 70, height 5\' 7"  (1.702 m), weight 150 lb (68 kg), SpO2 98 %, unknown if currently breastfeeding. General: No acute distress.  Patient appears well-groomed.  Head:  Normocephalic/atraumatic Eyes:  Fundi examined but not visualized Neck: supple, no  paraspinal tenderness, full range of motion Heart:  Regular rate and rhythm Lungs:  Clear to auscultation bilaterally Back: No paraspinal tenderness Neurological Exam: alert and oriented to person, place, and time. Attention span and concentration intact, recent and remote memory intact, fund of knowledge intact.  Speech fluent and not dysarthric, language intact.  CN II-XII intact. Bulk and tone normal, muscle strength 5/5 throughout.  Sensation to light touch intact.  Deep tendon reflexes 2+ throughout, toes downgoing.  Finger to nose testing intact.  Gait normal, Romberg negative.  IMPRESSION: Chronic migraine without aura, without status migrainosus, not intractable  PLAN: 1.  For preventative management, will restart Botox (submit for pre-authorization today).  It was previously effective, she has over 3 consecutive months of over 15 headache days and has failed or had side effects to multiple preventatives such as venlafaxine, nortriptyline, topiramate, and beta blocker. 2.  For abortive therapy, Fioricet (options are limited while still breastfeeding). 3.  Limit use of pain relievers to no more than 2 days out of week to prevent risk of rebound or medication-overuse headache. 4.  Keep headache diary 5.  Exercise, hydration, caffeine cessation, sleep hygiene, monitor for and avoid triggers 6.  Consider:  magnesium citrate 400mg  daily, riboflavin 400mg  daily, and coenzyme Q10 100mg  three times daily 7.  Follow up for botox   Shon Millet, DO  CC: Maurice Small, MD

## 2018-04-17 ENCOUNTER — Encounter: Payer: Self-pay | Admitting: Neurology

## 2018-04-17 ENCOUNTER — Encounter: Payer: Self-pay | Admitting: *Deleted

## 2018-04-17 ENCOUNTER — Ambulatory Visit (INDEPENDENT_AMBULATORY_CARE_PROVIDER_SITE_OTHER): Payer: 59 | Admitting: Neurology

## 2018-04-17 VITALS — BP 114/72 | HR 70 | Ht 67.0 in | Wt 150.0 lb

## 2018-04-17 DIAGNOSIS — G43709 Chronic migraine without aura, not intractable, without status migrainosus: Secondary | ICD-10-CM

## 2018-04-17 NOTE — Patient Instructions (Signed)
1.  We will get pre-authorization for Botox 2.  Continue Fioricet but limit use to no more than 2 days out of week to prevent risk of rebound or medication-overuse headache. 3.  Follow up for Botox

## 2018-04-17 NOTE — Progress Notes (Signed)
04/17/2018 Botox Prior auth initiated and sent to plan via covermymeds. Key [id number]

## 2018-04-20 NOTE — Progress Notes (Addendum)
Received prior auth approval notification from [employer] [place] reference# [id number] buy and bill  Valid 05/02/2018-10/29/2018  Called pt [phone] left message that she is good to go and to call us [phone]  if she is unable to keep her appointment 05/05/18 @10 :10am

## 2018-04-28 ENCOUNTER — Telehealth: Payer: Self-pay

## 2018-04-28 NOTE — Telephone Encounter (Signed)
Pt called office to check status on Botox. I advised her a message had been left for her and the Botox has been approved. Pt will keep appt on 2/28

## 2018-05-05 ENCOUNTER — Ambulatory Visit (INDEPENDENT_AMBULATORY_CARE_PROVIDER_SITE_OTHER): Payer: 59 | Admitting: Neurology

## 2018-05-05 DIAGNOSIS — G43709 Chronic migraine without aura, not intractable, without status migrainosus: Secondary | ICD-10-CM

## 2018-05-05 MED ORDER — ONABOTULINUMTOXINA 100 UNITS IJ SOLR
155.0000 [IU] | Freq: Once | INTRAMUSCULAR | Status: AC
  Administered 2018-05-05: 155 [IU] via INTRAMUSCULAR

## 2018-05-05 MED ORDER — BUTALBITAL-APAP-CAFFEINE 50-325-40 MG PO TABS: 2.0000 | ORAL_TABLET | Freq: Three times a day (TID) | ORAL | 3 refills | Status: DC | PRN

## 2018-05-05 NOTE — Progress Notes (Signed)
[facility]   Procedure Note Botox  Attending: Dr. Everlena Cooper  Preoperative Diagnosis(es): Chronic migraine  Consent obtained from: Patient. Benefits discussed included, but were not limited to decreased muscle tightness, increased joint range of motion, and decreased pain.  Risk discussed included, but were not limited pain and discomfort, bleeding, bruising, excessive weakness, venous thrombosis, muscle atrophy and dysphagia.  Anticipated outcomes of the procedure as well as he risks and benefits of the alternatives to the procedure, and the roles and tasks of the personnel to be involved, were discussed with the patient, and the patient consents to the procedure and agrees to proceed. A copy of the patient medication guide was given to the patient which explains the blackbox warning.  Patients identity and treatment sites confirmed: Yes.  Details of Procedure: Skin was cleaned with alcohol. Prior to injection, the needle plunger was aspirated to make sure the needle was not within a blood vessel.  There was no blood retrieved on aspiration.    Following is a summary of the muscles injected  And the amount of Botulinum toxin used:  Dilution 200 units of Botox was reconstituted with 4 ml of preservative free normal saline. Time of reconstitution: At the time of the office visit (<30 minutes prior to injection)   Injections  155 total units of Botox was injected with a 30 gauge needle.  Injection Sites: L occipitalis: 15 units- 3 sites  R occiptalis: 15 units- 3 sites  L upper trapezius: 15 units- 3 sites R upper trapezius: 15 units- 3 sits          L paraspinal: 10 units- 2 sites R paraspinal: 10 units- 2 sites  Face L frontalis(2 injection sites):10 units   R frontalis(2 injection sites):10 units         L corrugator: 5 units   R corrugator: 5 units           Procerus: 5 units   L temporalis: 20 units R temporalis: 20 units   Agent:  200 units of botulinum Type A  (Onobotulinum Toxin type A) was reconstituted with 4 ml of preservative free normal saline.  Time of reconstitution: At the time of the office visit (<30 minutes prior to injection)     Total injected (Units): 155  Total wasted (Units): No waste  Patient tolerated procedure well without complications.   Reinjection is anticipated in 3 months. Return to clinic in 4.5 months

## 2018-05-05 NOTE — Addendum Note (Signed)
Addended by: Dorthy Cooler on: 05/05/2018 03:35 PM   Modules accepted: Orders

## 2018-07-24 ENCOUNTER — Encounter: Payer: Self-pay | Admitting: *Deleted

## 2018-07-24 NOTE — Progress Notes (Signed)
Herma Ard (Key: [id number]) - SRR  Need help? Call us at [phone]  Status  Sent to Plan today 07/24/2018 Next Steps  The plan will fax you a determination, typically within 1 to 5 business days. How do I follow up?  DrugBotox 200UNIT solution  FormBlue Cross [place] Commercial Botox FormPrior Authorization for [phone] [id number]

## 2018-07-27 ENCOUNTER — Telehealth: Payer: Self-pay | Admitting: *Deleted

## 2018-07-27 NOTE — Telephone Encounter (Signed)
LMOM that new insurance is not authorizing Botox.  She returned my call as I was writing this note. I told her we are working on approval, her insurance stated that she has not tried CRGP meds. She stated she is breastfeeding an infant and does not want to "try" other meds at this time. Also let her know the phone call I received from [facility] left a phone number that is invalid. I will keep her up to date on progress or failure as it happens. She stated she is still having headaches.

## 2018-07-27 NOTE — Progress Notes (Signed)
Returned call to Charlotte Sanes [phone]  To explain patient has been on Botox which has been effective and does not want to try new meds because she is breastfeeding. Also told Charlotte Sanes she should not have to try new meds unless everyone in her same plan has to if they are not new to Botox treatment.  Charlotte Sanes said to expect response via faxed letter no longer than 72 hours. Also told her that the form in covermymeds is not up to date with their new form which asks about trial of CRGP meds.

## 2018-07-28 NOTE — Telephone Encounter (Signed)
Sending appeal

## 2018-07-28 NOTE — Telephone Encounter (Signed)
I had already discussed with Mrs. Fierro that Botox is thought to be compatible with breastfeeding although date is limited.  It is a very small amount injected into the muscle and not secreted into the breast milk.  I called and spoke with Amil Amen.  She has responded well to the first dose of Botox and would like to proceed.  I am not sure why it is now being denied.  She has history of chronic migraines (well over 3 consecutive months of over 15 headache days), failed multiple preventatives, and previously responded well to Botox.  The only thing I can think of is that they may want her to try an anti-CGRP first.  My response to that is there is no data regarding safety in breastfeeding.Marland Kitchen

## 2018-08-01 NOTE — Progress Notes (Addendum)
See letter of appeal sent to [facility] on 07/28/18. [facility] customer service [phone], spoke with Tione P. We discussed why Pt is unable to use CGRP's, and that I was calling for the status of the expedited appeal I faxed on  07/28/18. Tione said I was given the wrong fax number for expedited appeals and the only way to get the patient coverage determination by 5/29 was a peer to peer, otherwise, it will take 30 days. She did advise it would be buy and bill if approved.  Dr. Everlena Cooper will receive call from Dr. Park Pope at 11:45am tomorrow 08/02/18.  Dr. Everlena Cooper is aware.

## 2018-08-02 NOTE — Progress Notes (Addendum)
Rcvd call from Dr. Normand Sloop with [employer]. We discussed the Pt's hx of Botox therapy and that the CGRP's have not been in use as long, and she agreed with Dr Moises Blood recommendation of continuation of Botox. We will receive fax with this information. Ok to proceed with injections for Pt on 5/29 and to buy and bill.  Called Pt, LMOVM advising her Botox has been approved  Rcvd fax from [facility]. Botox approved Reference # [id number] Valid 07/24/18 - 01/20/19

## 2018-08-04 ENCOUNTER — Encounter

## 2018-08-04 ENCOUNTER — Ambulatory Visit: Payer: 59 | Admitting: Neurology

## 2018-09-04 ENCOUNTER — Telehealth: Payer: Self-pay | Admitting: Neurology

## 2018-09-04 NOTE — Telephone Encounter (Signed)
Patient called after hours and talked to them about having a horrible migraine. Only med she can take is the Fioricet since she is nursing and has already taken earlier this week. Was instructed to go to ER or see PCP within 24 hours. Thanks!

## 2018-09-04 NOTE — Telephone Encounter (Signed)
Called and spoke with Pt. We discussed her headache intensity, what she has taken and how often.  She has not had any Fioricet since Saturday. She is attempting to limit that to 2x a week to prevent rebound headaches. She was alternating Motrin and Tylenol with Fioricet on Saturday, and though it did not completely relieve the headache, it made it tolerable. I cautioned her on the amount of daily Tylenol and reminded her Fioricet does have acetaminophen in it as well.  We discussed hydration, eating healthy foods rich with magnesium and potassium, as well as utilizing an ice on the back of her neck.   Pt questioned adding magnesium OTC supplements she already has. I advised her I know of no precautions for breastfeeding and magnesium. From the information I have, very little amounts of magnesium are transferred through breast milk. I advised her to search the information herself for further clarification.

## 2018-09-26 NOTE — Progress Notes (Addendum)
NEUROLOGY FOLLOW UP OFFICE NOTE  Erroll LunaJulia E Hejl [id number]  HISTORY OF PRESENT ILLNESS: Amy Yang is a 43 year old Caucasian woman who follows up for migraines.  Currently breastfeeding.  Started Botox.  One round of Botox. It helped. However, the cost went to her deductible and she couldn't afford it. So migraines are terrible. Intensity:  Severe Duration:  Several hours Frequency:  Daily Treats headaches with:  Fioricet or Advil or Fioricet and Advil Current NSAIDS:Advil 600mg  (every 3 to 4 days) Current analgesics:Fioricet (every other week) Current triptans:none Current anti-emetic:none Current muscle relaxants:none Current anti-anxiolytic:none Current sleep aide:none Current Antihypertensive medications:none Current Antidepressant medications:none Current Anticonvulsant medications:none Current anti-CGRP:  None Current Vitamins/Herbal/Supplements:Magnesium, vitamin B6 Current Antihistamines/Decongestants:Benadryl Other therapy:Botox (s/p 1 round); Acupuncture  Caffeine:Highly sensitive to caffeine and headache trigger.  Alcohol:no Smoker:no Diet:Needs to increase water intake Exercise:routine Depression/anxiety:okay Sleep hygiene:ok.  Son sleeping through night.  HISTORY: Onset:  Since 5th grade Location:Either side Quality:Throbbing/stabbing Initial Intensity:8/10 Aura:no Prodrome:no Associated symptoms:  Nausea, photophobia. Some blurred vision. No vomiting. She has not had any new worse headache of her life, waking up from sleep Initial Duration:Up to 48 hours Initial Frequency:6 days a month of severe migraine but has daily headache Initial Frequency of abortive medication:only as needed. Triggers:  Change in weather, hormonal/menstrual cycle, caffeine Relieving factors:  Massage, ice, Tylenol, Bendaryl Activity:aggravates headache. Needs to lay down for severe migraines. She does have daily  bi-temporal non-throbbing vice-like headache as well.  Past NSAIDS:Ibuprofen, naproxen Past analgesics:Tramadol, Fioricet Past abortive triptans:Sumatriptan (tablet/NS/[place]), Relpax, Zomig. Treximet effective. Past anti-emetic:  Reglan Past antihypertensive medications:labetolol (low blood pressure) Past antidepressant medications:Nortriptyline, venlafaxine Past anticonvulsant medications: Topiramate 100mg  (effective but side effects) Past vitamins/Herbal/Supplements:no Other past therapies: Botox(effective), trigger point injections, biofeedback  Family history of headache:  No  PAST MEDICAL HISTORY: Past Medical History:  Diagnosis Date  . Bronchitis   . Encounter for infertility   . Kidney stones   . Kidney stones   . Migraine   . Pneumonia   . Preterm labor     MEDICATIONS: Current Outpatient Medications on File Prior to Visit  Medication Sig Dispense Refill  . butalbital-acetaminophen-caffeine (FIORICET, ESGIC) 50-325-40 MG tablet Take 2 tablets by mouth 3 (three) times daily as needed. 20 tablet 3  . dibucaine (NUPERCAINAL) 1 % OINT Place 1 application rectally as needed for hemorrhoids. (Patient not taking: Reported on 04/17/2018) 1 Tube 0  . ibuprofen (ADVIL,MOTRIN) 600 MG tablet Take 1 tablet (600 mg total) by mouth every 6 (six) hours. 30 tablet 0  . metoCLOPramide (REGLAN) 5 MG tablet Take 1 tablet (5 mg total) by mouth every 6 (six) hours as needed for nausea. 10 tablet 0  . Prenatal Vit-Fe Fumarate-FA (PRENATAL VITAMIN PO) Take 1 tablet by mouth daily.     Marland Kitchen. witch hazel-glycerin (TUCKS) pad Apply 1 application topically as needed for hemorrhoids. (Patient not taking: Reported on 01/20/2018) 40 each 12   No current facility-administered medications on file prior to visit.     ALLERGIES: Allergies  Allergen Reactions  . Latex   . Morphine And Related Nausea And Vomiting  . Pineapple   . Rhinocort [Budesonide]   . Symbicort [Budesonide-Formoterol  Fumarate] Swelling    FAMILY HISTORY: Family History  Problem Relation Age of Onset  . Heart disease Father   . Asthma Sister   . Cancer Maternal Grandmother   . Cancer Maternal Grandfather   . Cancer Paternal Grandmother   . Diabetes Paternal Grandmother   .  Stroke Paternal Grandfather   .  SOCIAL HISTORY: Social History   Socioeconomic History  . Marital status: Married    Spouse name: Not on file  . Number of children: 2  . Years of education: Not on file  . Highest education level: Not on file  Occupational History  . Occupation: Corporate investment bankerECRUITER  Social Needs  . Financial resource strain: Not on file  . Food insecurity    Worry: Not on file    Inability: Not on file  . Transportation needs    Medical: Not on file    Non-medical: Not on file  Tobacco Use  . Smoking status: Never Smoker  . Smokeless tobacco: Never Used  Substance and Sexual Activity  . Alcohol use: Yes    Comment: occasionally; socially  . Drug use: No  . Sexual activity: Yes  Lifestyle  . Physical activity    Days per week: Not on file    Minutes per session: Not on file  . Stress: Not on file  Relationships  . Social Musicianconnections    Talks on phone: Not on file    Gets together: Not on file    Attends religious service: Not on file    Active member of club or organization: Not on file    Attends meetings of clubs or organizations: Not on file    Relationship status: Not on file  . Intimate partner violence    Fear of current or ex partner: Not on file    Emotionally abused: Not on file    Physically abused: Not on file    Forced sexual activity: Not on file  Other Topics Concern  . Not on file  Social History Narrative   Patient is right-handed. She lives with her husband and 2 children. She drinks hot tea 2 x a week. She works out the gym 3 x a week, and walks several times a week.    REVIEW OF SYSTEMS: Constitutional: No fevers, chills, or sweats, no generalized fatigue, change in  appetite Eyes: No visual changes, double vision, eye pain Ear, nose and throat: No hearing loss, ear pain, nasal congestion, sore throat Cardiovascular: No chest pain, palpitations Respiratory:  No shortness of breath at rest or with exertion, wheezes GastrointestinaI: No nausea, vomiting, diarrhea, abdominal pain, fecal incontinence Genitourinary:  No dysuria, urinary retention or frequency Musculoskeletal:  No neck pain, back pain Integumentary: No rash, pruritus, skin lesions Neurological: as above Psychiatric: No depression, insomnia, anxiety Endocrine: No palpitations, fatigue, diaphoresis, mood swings, change in appetite, change in weight, increased thirst Hematologic/Lymphatic:  No purpura, petechiae. Allergic/Immunologic: no itchy/runny eyes, nasal congestion, recent allergic reactions, rashes  PHYSICAL EXAM: Blood pressure 108/72, pulse 67, temperature 98.7 F (37.1 C), temperature source Oral, height 5\' 7"  (1.702 m), weight 142 lb (64.4 kg), SpO2 98 %, unknown if currently breastfeeding. General: No acute distress.  Patient appears well-groomed.   Head:  Normocephalic/atraumatic Eyes:  Fundi examined but not visualized Neck: supple, no paraspinal tenderness, full range of motion Heart:  Regular rate and rhythm Lungs:  Clear to auscultation bilaterally Back: No paraspinal tenderness Neurological Exam: alert and oriented. Attention span and concentration intact, recent and remote memory intact, fund of knowledge intact.  Speech fluent and not dysarthric, language intact.    IMPRESSION: Chronic migraine without aura, without status migrainosus, not intractable  PLAN: 1.  She is planning on stopping breastfeeding in September.  At that time, we will have more options for migriane preventative.  In the  meantime, I will refer her to Dr. Hulan Saas for OMM to help with migraines. 2.  For abortive therapy, Fioricet and Advil 3.  Limit use of pain relievers to no more than 2 days  out of week to prevent risk of rebound or medication-overuse headache. 4.  Keep headache diary 5.  Exercise, hydration, caffeine cessation, sleep hygiene, monitor for and avoid triggers 6.  Consider:  magnesium citrate 400mg  daily, riboflavin 400mg  daily, and coenzyme Q10 100mg  three times daily 7. Always keep in mind that currently taking a hormone or birth control may be a possible trigger or aggravating factor for migraine. 8. Follow up in 3 months.  ADDENDUM:  We checked with her insurance.  Her Botox was buy and bill.  She was only responsible for the copay in the office.  We will contact her to set up new appointment.  25 minutes spent with patient, over 50% spent discussing plan   Metta Clines, DO  CC: Kelton Pillar, MD

## 2018-09-27 ENCOUNTER — Ambulatory Visit (INDEPENDENT_AMBULATORY_CARE_PROVIDER_SITE_OTHER): Payer: BC Managed Care – PPO | Admitting: Neurology

## 2018-09-27 ENCOUNTER — Telehealth: Payer: Self-pay

## 2018-09-27 ENCOUNTER — Encounter: Payer: Self-pay | Admitting: Neurology

## 2018-09-27 ENCOUNTER — Other Ambulatory Visit: Payer: Self-pay

## 2018-09-27 VITALS — BP 108/72 | HR 67 | Temp 98.7°F | Ht 67.0 in | Wt 142.0 lb

## 2018-09-27 DIAGNOSIS — G43709 Chronic migraine without aura, not intractable, without status migrainosus: Secondary | ICD-10-CM | POA: Diagnosis not present

## 2018-09-27 MED ORDER — BUTALBITAL-APAP-CAFFEINE 50-325-40 MG PO TABS: 2.0000 | ORAL_TABLET | Freq: Three times a day (TID) | ORAL | 3 refills | Status: DC | PRN

## 2018-09-27 MED ORDER — ONDANSETRON 4 MG PO TBDP: 4.0000 mg | ORAL_TABLET | Freq: Three times a day (TID) | ORAL | 3 refills | Status: DC | PRN

## 2018-09-27 NOTE — Patient Instructions (Signed)
1.  Refilled Fioricet.  Limit use of pain relievers to no more than 2 days out of week to prevent risk of rebound or medication-overuse headache. 2.  Prescribed you ondansetron 4mg  dissolvable pill for nausea 3.  Follow up in 3 months or so

## 2018-09-27 NOTE — Telephone Encounter (Signed)
Follow up ° °Patient returning nurses call °

## 2018-09-27 NOTE — Telephone Encounter (Signed)
Called Pt to discuss Botox and that it was buy and bill, she was only responsible for office co-pay and to set up appt for next clinic. Pt stated someone had just arrived at her house and she would need to call me back.

## 2018-09-28 NOTE — Telephone Encounter (Signed)
Called and spoke with Pt. She wanted codes to call her insurance company to get the amounts of what she will be responsible for out of pocket for Botox. I provided her with the codes. She will call back to schedule if it is affordable.

## 2018-11-24 DIAGNOSIS — Z13220 Encounter for screening for lipoid disorders: Secondary | ICD-10-CM | POA: Diagnosis not present

## 2018-11-24 DIAGNOSIS — Z23 Encounter for immunization: Secondary | ICD-10-CM | POA: Diagnosis not present

## 2018-11-24 DIAGNOSIS — Z0000 Encounter for general adult medical examination without abnormal findings: Secondary | ICD-10-CM | POA: Diagnosis not present

## 2018-12-15 ENCOUNTER — Ambulatory Visit: Payer: BC Managed Care – PPO | Admitting: Neurology

## 2019-02-06 NOTE — Progress Notes (Signed)
Virtual Visit via Video Note The purpose of this virtual visit is to provide medical care while limiting exposure to the novel coronavirus.    Consent was obtained for video visit:  Yes.   Answered questions that patient had about telehealth interaction:  Yes.   I discussed the limitations, risks, security and privacy concerns of performing an evaluation and management service by telemedicine. I also discussed with the patient that there may be a patient responsible charge related to this service. The patient expressed understanding and agreed to proceed.  Pt location: Home Physician Location: office Name of referring provider:  Maurice Small, MD I connected with Erroll Luna at patients initiation/request on 02/08/2019 at 11:30 AM EST by video enabled telemedicine application and verified that I am speaking with the correct person using two identifiers. Pt MRN:  [id number] Pt DOB:  Feb 22, 1976 Video Participants:  Erroll Luna   History of Present Illness:  Amy Yang is a 43 year old Caucasian woman who follows up for migraines.  She has not been able to wean off breastfeeding.  The week going into Thanksgiving, she had 6 day intractable migraine.  She took Fioricet for about 3 days.  They are severe and last several hours.  She has had greater than 15 migraine days a day, however she has daily low-intensity headache.   She is getting new insurance next month, so she is scheduled for Botox in February.  Treats headaches with:  Fioricet or Advil or Fioricet and Advil Current NSAIDS:Advil 600mg  (every 3 to 4 days) Current analgesics:Fioricet (every other week) Current triptans:none Current anti-emetic:Zofran ODT 4mg  Current muscle relaxants:none Current anti-anxiolytic:none Current sleep aide:none Current Antihypertensive medications:none Current Antidepressant medications:none Current Anticonvulsant medications:none Current anti-CGRP: None Current  Vitamins/Herbal/Supplements:Magnesium, vitamin B6 Current Antihistamines/Decongestants:Benadryl Current hormone/birth control:  Mirena Other therapy:Acupuncture  Caffeine:Highly sensitive to caffeine and headache trigger.  Alcohol:no Smoker:no Diet:Needs to increase water intake Exercise:routine Depression/anxiety:okay Sleep hygiene:ok. Son sleeping through night.  HISTORY: Onset:  Since 5th grade Location:Either side Quality:Throbbing/stabbing InitialIntensity:8/10 Aura:no Prodrome:no Associated symptoms: Nausea, photophobia. Some blurred vision. No vomiting. She has not had any new worse headache of her life, waking up from sleep InitialDuration:Up to 48 hours InitialFrequency:6 days a month of severe migraine but has daily headache InitialFrequency of abortive medication:only as needed. Triggers:  Change in weather, hormonal/menstrual cycle, caffeine Relieving factors:  Massage, ice, Tylenol, Bendaryl Activity:aggravates headache. Needs to lay down for severe migraines. She does have daily bi-temporal non-throbbing vice-like headache as well.  Past NSAIDS:Ibuprofen, naproxen Past analgesics:Tramadol, Fioricet Past abortive triptans:Sumatriptan (tablet/NS/[place]), Relpax, Zomig. Treximet (most effective). Past anti-emetic:  Reglan Past antihypertensive medications:labetolol (low blood pressure) Past antidepressant medications:Nortriptyline, venlafaxine Past anticonvulsant medications: Topiramate 100mg  (effectivebut side effects) Past vitamins/Herbal/Supplements:no Other past therapies: Botox(s/p 1 round. It helped. However, the cost went to her deductible and she couldn't afford it.), trigger point injections, biofeedback  Family history of headache: No  Past Medical History: Past Medical History:  Diagnosis Date  . Bronchitis   . Encounter for infertility   . Kidney stones   . Kidney stones   . Migraine    . Pneumonia   . Preterm labor     Medications: Outpatient Encounter Medications as of 02/08/2019  Medication Sig  . butalbital-acetaminophen-caffeine (FIORICET) 50-325-40 MG tablet Take 2 tablets by mouth 3 (three) times daily as needed.  . dibucaine (NUPERCAINAL) 1 % OINT Place 1 application rectally as needed for hemorrhoids. (Patient not taking: Reported on 04/17/2018)  . ibuprofen (ADVIL,MOTRIN) 600  MG tablet Take 1 tablet (600 mg total) by mouth every 6 (six) hours.  . metoCLOPramide (REGLAN) 5 MG tablet Take 1 tablet (5 mg total) by mouth every 6 (six) hours as needed for nausea.  . ondansetron (ZOFRAN ODT) 4 MG disintegrating tablet Take 1 tablet (4 mg total) by mouth every 8 (eight) hours as needed for nausea or vomiting.  . Prenatal Vit-Fe Fumarate-FA (PRENATAL VITAMIN PO) Take 1 tablet by mouth daily.   Marland Kitchen witch hazel-glycerin (TUCKS) pad Apply 1 application topically as needed for hemorrhoids. (Patient not taking: Reported on 01/20/2018)   No facility-administered encounter medications on file as of 02/08/2019.     Allergies: Allergies  Allergen Reactions  . Latex   . Morphine And Related Nausea And Vomiting  . Pineapple   . Rhinocort [Budesonide]   . Symbicort [Budesonide-Formoterol Fumarate] Swelling    Family History: Family History  Problem Relation Age of Onset  . Heart disease Father   . Asthma Sister   . Cancer Maternal Grandmother   . Cancer Maternal Grandfather   . Cancer Paternal Grandmother   . Diabetes Paternal Grandmother   . Stroke Paternal Grandfather     Social History: Social History   Socioeconomic History  . Marital status: Married    Spouse name: Not on file  . Number of children: 2  . Years of education: Not on file  . Highest education level: Not on file  Occupational History  . Occupation: Human resources officer  Social Needs  . Financial resource strain: Not on file  . Food insecurity    Worry: Not on file    Inability: Not on file  .  Transportation needs    Medical: Not on file    Non-medical: Not on file  Tobacco Use  . Smoking status: Never Smoker  . Smokeless tobacco: Never Used  Substance and Sexual Activity  . Alcohol use: Yes    Comment: occasionally; socially  . Drug use: No  . Sexual activity: Yes  Lifestyle  . Physical activity    Days per week: Not on file    Minutes per session: Not on file  . Stress: Not on file  Relationships  . Social Herbalist on phone: Not on file    Gets together: Not on file    Attends religious service: Not on file    Active member of club or organization: Not on file    Attends meetings of clubs or organizations: Not on file    Relationship status: Not on file  . Intimate partner violence    Fear of current or ex partner: Not on file    Emotionally abused: Not on file    Physically abused: Not on file    Forced sexual activity: Not on file  Other Topics Concern  . Not on file  Social History Narrative   Patient is right-handed. She lives with her husband and 2 children. She drinks hot tea 2 x a week. She works out the gym 3 x a week, and walks several times a week.    Observations/Objective:   Height 5\' 7"  (1.702 m), weight 142 lb (64.4 kg), unknown if currently breastfeeding. No acute distress.  Alert and oriented.  Speech fluent and not dysarthric.  Language intact.  Eyes orthophoric on primary gaze.  Face symmetric.  Assessment and Plan:   Chronic migraine without aura, without status migrainosus, not intractable  1.  She is scheduled to restart Botox in February. 2.  For abortive therapy, I have prescribed her Treximet as it has been effective in the past (taking sumatriptan tablet with naproxen tablet ineffective).  If taken, she was instructed to hold breastfeeding for 12 hours. 3.  Limit use of pain relievers to no more than 2 days out of week to prevent risk of rebound or medication-overuse headache. 4.  Keep headache diary 5.  Exercise,  hydration, caffeine cessation, sleep hygiene, monitor for and avoid triggers 6.  Consider:  magnesium citrate 400mg  daily, riboflavin 400mg  daily, and coenzyme Q10 100mg  three times daily 7. Follow up in February for Botox.   Follow Up Instructions:    -I discussed the assessment and treatment plan with the patient. The patient was provided an opportunity to ask questions and all were answered. The patient agreed with the plan and demonstrated an understanding of the instructions.   The patient was advised to call back or seek an in-person evaluation if the symptoms worsen or if the condition fails to improve as anticipated.   Cira ServantAdam Robert Reise Hietala, DO

## 2019-02-08 ENCOUNTER — Telehealth: Payer: Self-pay

## 2019-02-08 ENCOUNTER — Telehealth (INDEPENDENT_AMBULATORY_CARE_PROVIDER_SITE_OTHER): Payer: BC Managed Care – PPO | Admitting: Neurology

## 2019-02-08 ENCOUNTER — Encounter: Payer: Self-pay | Admitting: Neurology

## 2019-02-08 ENCOUNTER — Other Ambulatory Visit: Payer: Self-pay

## 2019-02-08 VITALS — Ht 67.0 in | Wt 142.0 lb

## 2019-02-08 DIAGNOSIS — G43709 Chronic migraine without aura, not intractable, without status migrainosus: Secondary | ICD-10-CM

## 2019-02-08 MED ORDER — SUMATRIPTAN-NAPROXEN SODIUM 85-500 MG PO TABS: 1.0000 | ORAL_TABLET | ORAL | 2 refills | Status: DC | PRN

## 2019-02-08 NOTE — Telephone Encounter (Signed)
I made the patient aware that Treximet may not be covered but she requested that it be prescribed as she may pay out of pocket.

## 2019-02-08 NOTE — Telephone Encounter (Signed)
HARD FAX FROM [facility]   TREXIMENT 85/500 MG  Not covered under patient insurance plan  alternative request

## 2019-02-09 NOTE — Telephone Encounter (Signed)
Called pharmacy they was informed that patient would like to pay out of pocket for RX.

## 2019-02-16 ENCOUNTER — Telehealth: Payer: Self-pay

## 2019-02-16 NOTE — Telephone Encounter (Signed)
Patients insurance does not cover Treximet.  May try separating the 2 medications.  Please advise.

## 2019-02-19 NOTE — Telephone Encounter (Signed)
Left message for pharmacy to process triximet and contact patient with cost.

## 2019-02-19 NOTE — Telephone Encounter (Signed)
Patient already aware that Treximet isn't covered.  She wanted me to order it so she can find out if it would be affordable out of pocket.

## 2019-04-02 ENCOUNTER — Encounter: Payer: Self-pay | Admitting: *Deleted

## 2019-04-02 NOTE — Progress Notes (Signed)
tient Name  SR Submission Date  Service Request # (SR #) Type  Last Treatment Date  Next Injection Date Location Prescriber Status Action    ? [place], [place] 04/02/2019 [facility] Benefit Verification 05/05/2018 07/28/2018 [facility] Shon Millet In Progress  I called to get her new insurance information Rosann Auerbach [id number]  01 subscriber self Group [id number] [phone] Claims address: [id number] [place] [address]

## 2019-04-10 NOTE — Progress Notes (Addendum)
Faxed clinicals and answered questions on worksheet from [place]. Fax#[phone]   04/12/2019 received fax from [facility]  Request ID [id number] Authorization # [id number] Approved 04/10/2019-04/09/2020 Total 620 units [id number] is included in the request  Letter sent to scan into her chart

## 2019-04-20 ENCOUNTER — Ambulatory Visit (INDEPENDENT_AMBULATORY_CARE_PROVIDER_SITE_OTHER): Payer: Managed Care, Other (non HMO) | Admitting: Neurology

## 2019-04-20 ENCOUNTER — Other Ambulatory Visit: Payer: Self-pay

## 2019-04-20 DIAGNOSIS — G43709 Chronic migraine without aura, not intractable, without status migrainosus: Secondary | ICD-10-CM | POA: Diagnosis not present

## 2019-04-20 MED ORDER — ONABOTULINUMTOXINA 100 UNITS IJ SOLR
155.0000 [IU] | Freq: Once | INTRAMUSCULAR | Status: AC
  Administered 2019-04-20: 14:00:00 155 [IU] via INTRAMUSCULAR

## 2019-04-20 NOTE — Progress Notes (Signed)
[facility]   Procedure Note Botox  Attending: Dr. Shon Millet  Preoperative Diagnosis(es): Chronic migraine  Consent obtained from: The patient Benefits discussed included, but were not limited to decreased muscle tightness, increased joint range of motion, and decreased pain.  Risk discussed included, but were not limited pain and discomfort, bleeding, bruising, excessive weakness, venous thrombosis, muscle atrophy and dysphagia.  Anticipated outcomes of the procedure as well as he risks and benefits of the alternatives to the procedure, and the roles and tasks of the personnel to be involved, were discussed with the patient, and the patient consents to the procedure and agrees to proceed. A copy of the patient medication guide was given to the patient which explains the blackbox warning.  Patients identity and treatment sites confirmed Yes.  .  Details of Procedure: Skin was cleaned with alcohol. Prior to injection, the needle plunger was aspirated to make sure the needle was not within a blood vessel.  There was no blood retrieved on aspiration.    Following is a summary of the muscles injected  And the amount of Botulinum toxin used:  Dilution 200 units of Botox was reconstituted with 4 ml of preservative free normal saline. Time of reconstitution: At the time of the office visit (<30 minutes prior to injection)   Injections  155 total units of Botox was injected with a 30 gauge needle.  Injection Sites: L occipitalis: 15 units- 3 sites  R occiptalis: 15 units- 3 sites  L upper trapezius: 15 units- 3 sites R upper trapezius: 15 units- 3 sits          L paraspinal: 10 units- 2 sites R paraspinal: 10 units- 2 sites  Face L frontalis(2 injection sites):10 units   R frontalis(2 injection sites):10 units         L corrugator: 5 units   R corrugator: 5 units           Procerus: 5 units   L temporalis: 20 units R temporalis: 20 units   Agent:  200 units of botulinum Type  A (Onobotulinum Toxin type A) was reconstituted with 4 ml of preservative free normal saline.  Time of reconstitution: At the time of the office visit (<30 minutes prior to injection)     Total injected (Units): 155  Total wasted (Units): none  Patient tolerated procedure well without complications.   Reinjection is anticipated in 3 months. Return to clinic in 4 1/2 months.

## 2019-04-27 ENCOUNTER — Telehealth: Payer: Self-pay | Admitting: Neurology

## 2019-04-27 ENCOUNTER — Other Ambulatory Visit: Payer: Self-pay | Admitting: Neurology

## 2019-04-27 NOTE — Telephone Encounter (Signed)
Patient called the office back. She said that she is still breast feeding. Also, 2 medications one that the nurse mentioned she is still taking that. She was still taking that through pregnancy and now. She said the other medication she does not breast feed 12 to 14 hours after taking the medication. Please Call. Thank you

## 2019-04-30 NOTE — Telephone Encounter (Signed)
Called patient and advised

## 2019-06-01 IMAGING — US US MFM OB DETAIL+14 WK
1 series · 13 of 28 positions shown · non-contrast
Comparison: none

[Series 1: us mfm ob detail+14 wk · 88 acquisitions, 13 frames shown]
[im 4/88]
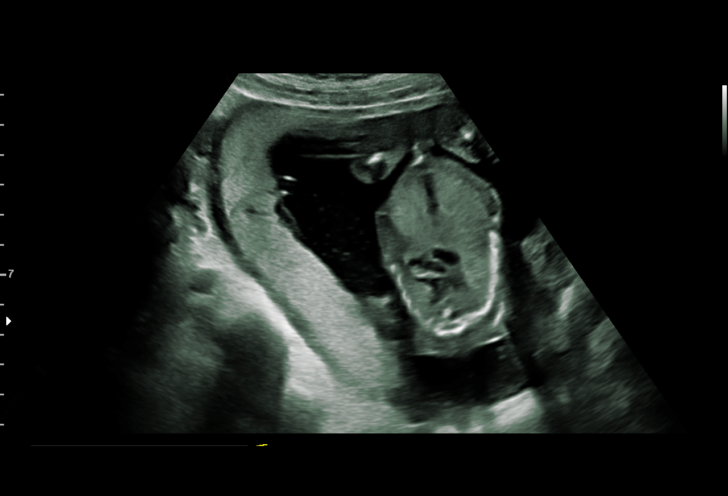
[im 10/88]
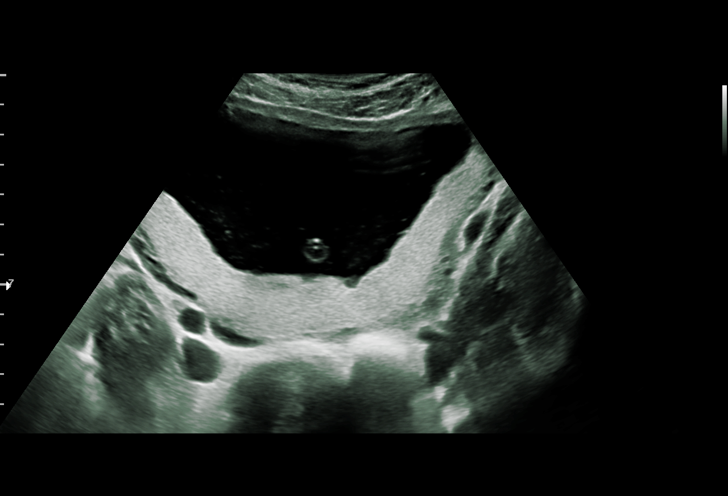
[im 17/88]
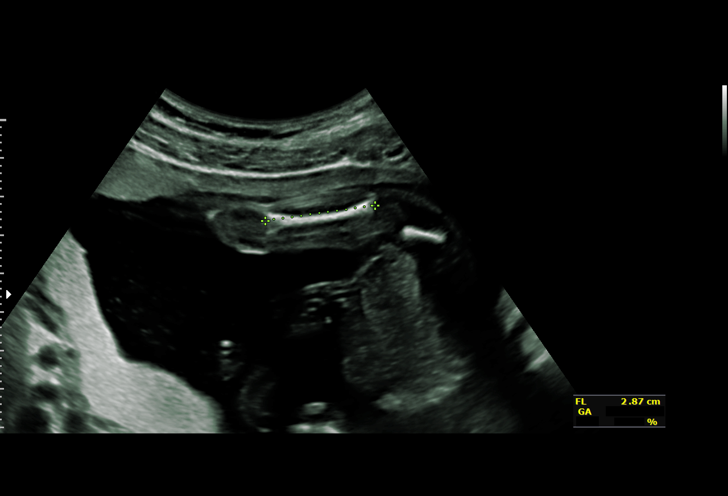
[im 23/88]
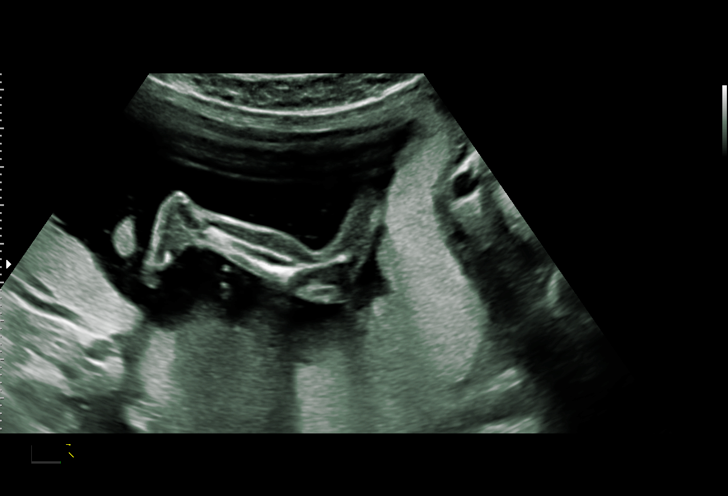
[im 30/88]
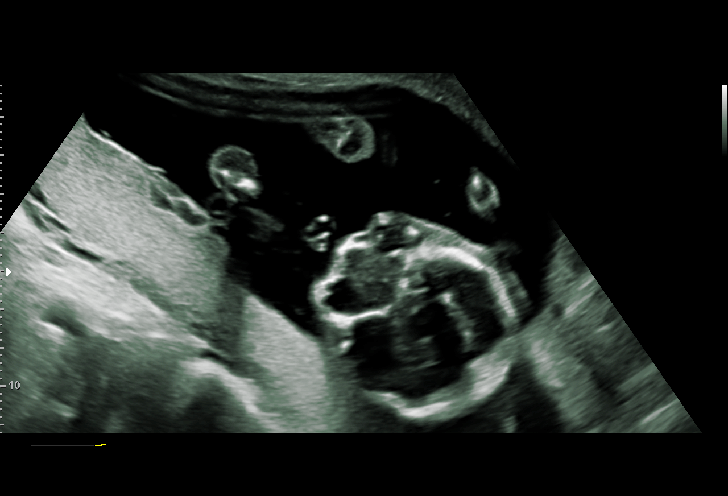
[im 36/88]
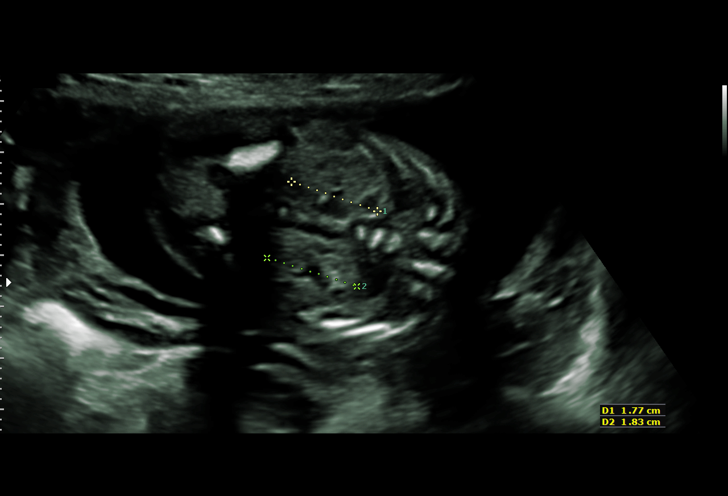
[im 46/88]
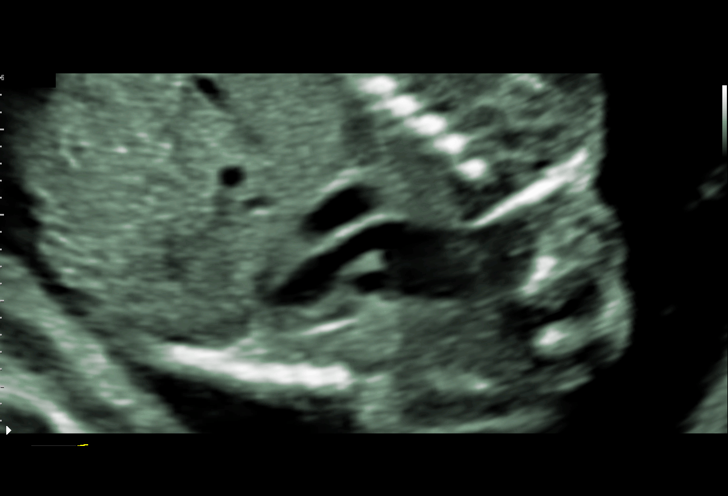
[im 52/88]
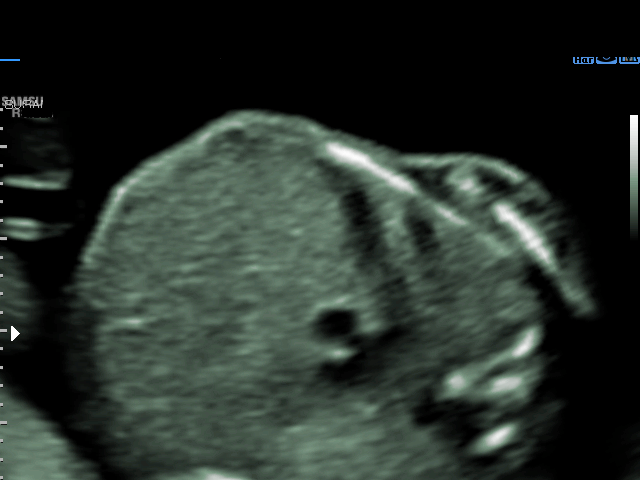
[im 59/88]
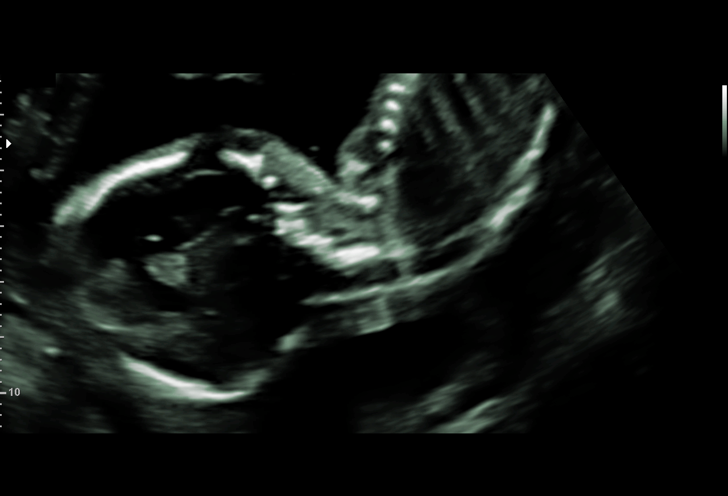
[im 65/88]
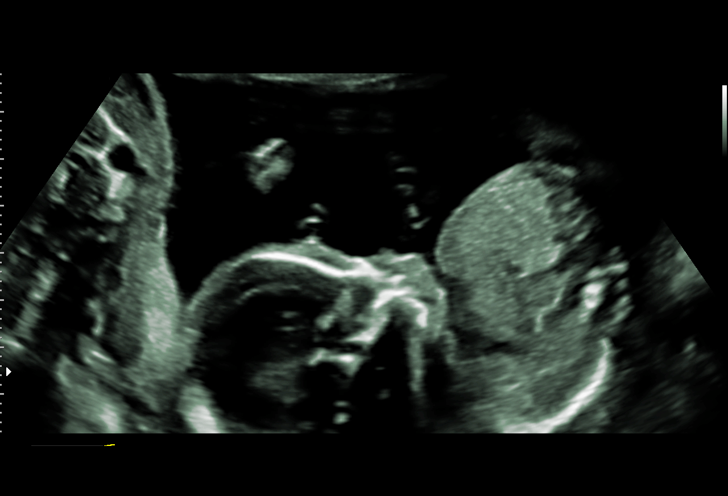
[im 71/88]
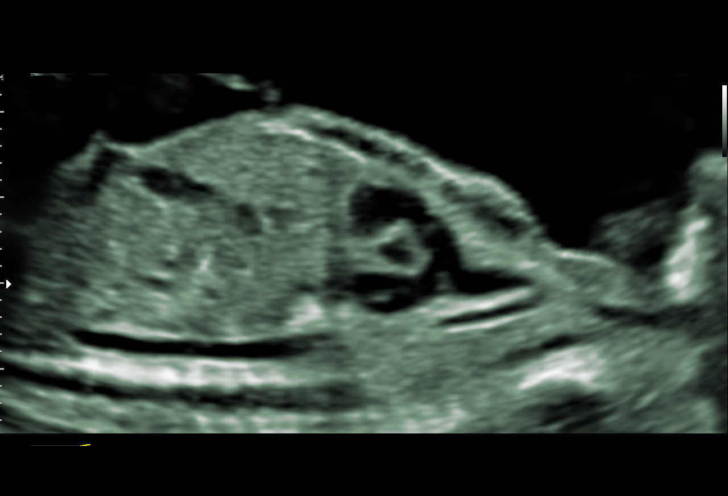
[im 78/88]
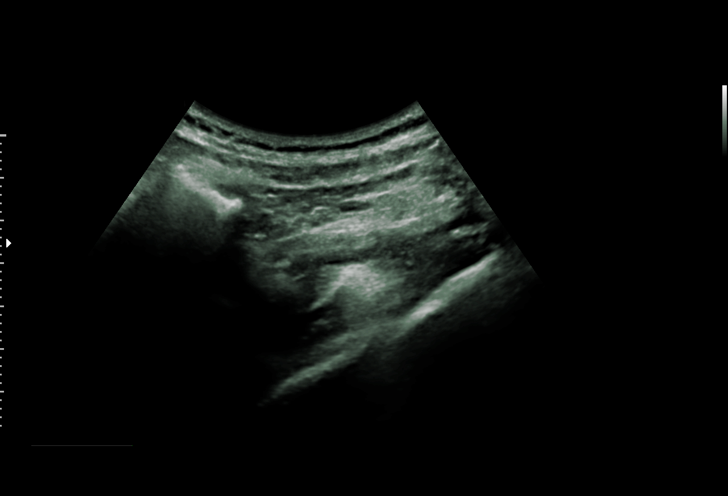
[im 84/88]
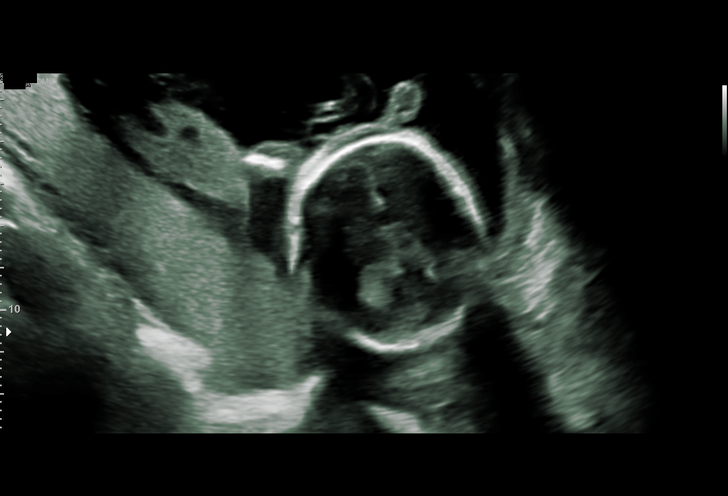

[13 of 28 positions shown; findings below may reference images not displayed]

& Infertility
[id number] [REDACTED]

1  NISI JETA CADIKOVSKI           [id number]      [id number]     [id number]
Indications

18 weeks gestation of pregnancy
Pregnancy resulting from assisted
reproductive technology - IVF
Advanced maternal age multigravida 37+,
second trimester
Encounter for antenatal screening for
malformations
Poor obstetric history: Previous
preeclampsia / eclampsia/gestational HTN
Normal MSAFP @ 16 weeks
Normal first trimester screen
OB History

Gravidity:    2         Term:   1
Living:       1
Fetal Evaluation

Num Of Fetuses:     1
Fetal Heart         141
Rate(bpm):
Cardiac Activity:   Observed
Presentation:       Cephalic
Placenta:           Posterior, above cervical os
P. Cord Insertion:  Visualized, central

Amniotic Fluid
AFI FV:      Subjectively within normal limits

Largest Pocket(cm)
5.4
Biometry

BPD:        44  mm     G. Age:  19w 2d         70  %    CI:         74.5   %    70 - 86
FL/HC:      18.0   %    16.1 -
HC:      161.8  mm     G. Age:  19w 0d         48  %    HC/AC:      1.14        1.09 -
AC:      141.8  mm     G. Age:  19w 4d         69  %    FL/BPD:     66.1   %
FL:       29.1  mm     G. Age:  19w 0d         47  %    FL/AC:      20.5   %    20 - 24
HUM:      29.4  mm     G. Age:  19w 4d         71  %

LV:        5.9  mm

Est. FW:     282  gm    0 lb 10 oz      52  %
Gestational Age

Clinical EDD:  18w 6d                                        EDD:   12/01/17
U/S Today:     19w 2d                                        EDD:   11/28/17
Best:          18w 6d     Det. By:  Clinical EDD             EDD:   12/01/17
Anatomy

Cranium:               Appears normal         Aortic Arch:            Not well visualized
Cavum:                 Not well visualized    Ductal Arch:            Not well visualized
Ventricles:            Appears normal         Diaphragm:              Appears normal
Choroid Plexus:        Appears normal         Stomach:                Appears normal, left
sided
Cerebellum:            Not well visualized    Abdomen:                Appears normal
Posterior Fossa:       Appears normal         Abdominal Wall:         Appears nml (cord
insert, abd wall)
Nuchal Fold:           Not applicable (>20    Cord Vessels:           Appears normal (3
wks GA)                                        vessel cord)
Face:                  Appears normal         Kidneys:                Appear normal
(orbits and profile)
Lips:                  Appears normal         Bladder:                Appears normal
Thoracic:              Appears normal         Spine:                  Ltd view
Heart:                 Appears normal         Upper Extremities:      Appears normal
(4CH, axis, and
situs)
RVOT:                  Appears normal         Lower Extremities:      Appears normal
LVOT:                  Appears normal

Other:  Nasal bone visualized. Heels and 5th digit visualized. Male gender.
Technically difficult due to fetal position.
Cervix Uterus Adnexa

Cervix
Length:            3.1  cm.
Normal appearance by transabdominal scan.

Left Ovary
Within normal limits.

Right Ovary
Within normal limits.
Impression

Singleton intrauterine pregnancy at 18+6 weeks AMA and
IVF pregnancy with here for anatomic survey
Review of the anatomy shows no sonographic markers for
aneuploidy or structural anomalies
However, views of the fetal heart should be considered
suboptimal secondary to fetal position
Amniotic fluid volume is normal
Estimated fetal weight shows growth in the 52nd percentile
Recommendations

Recommend follow-up ultrasound examination in 4 weeks for
completion of the anatomic survey

## 2019-07-20 ENCOUNTER — Ambulatory Visit (INDEPENDENT_AMBULATORY_CARE_PROVIDER_SITE_OTHER): Payer: Managed Care, Other (non HMO) | Admitting: Neurology

## 2019-07-20 ENCOUNTER — Other Ambulatory Visit: Payer: Self-pay

## 2019-07-20 DIAGNOSIS — G43709 Chronic migraine without aura, not intractable, without status migrainosus: Secondary | ICD-10-CM | POA: Diagnosis not present

## 2019-07-20 MED ORDER — ONABOTULINUMTOXINA 100 UNITS IJ SOLR
200.0000 [IU] | Freq: Once | INTRAMUSCULAR | Status: AC
  Administered 2019-07-20: 155 [IU] via INTRAMUSCULAR

## 2019-07-20 NOTE — Progress Notes (Signed)
[facility]   Procedure Note Botox  Attending: Dr. Shon Millet  Preoperative Diagnosis(es): Chronic migraine  Consent obtained from: The patient Benefits discussed included, but were not limited to decreased muscle tightness, increased joint range of motion, and decreased pain.  Risk discussed included, but were not limited pain and discomfort, bleeding, bruising, excessive weakness, venous thrombosis, muscle atrophy and dysphagia.  Anticipated outcomes of the procedure as well as he risks and benefits of the alternatives to the procedure, and the roles and tasks of the personnel to be involved, were discussed with the patient, and the patient consents to the procedure and agrees to proceed. A copy of the patient medication guide was given to the patient which explains the blackbox warning.  Patients identity and treatment sites confirmed Yes.  .  Details of Procedure: Skin was cleaned with alcohol. Prior to injection, the needle plunger was aspirated to make sure the needle was not within a blood vessel.  There was no blood retrieved on aspiration.    Following is a summary of the muscles injected  And the amount of Botulinum toxin used:  Dilution 200 units of Botox was reconstituted with 4 ml of preservative free normal saline. Time of reconstitution: At the time of the office visit (<30 minutes prior to injection)   Injections  155 total units of Botox was injected with a 30 gauge needle.  Injection Sites: L occipitalis: 15 units- 3 sites  R occiptalis: 15 units- 3 sites  L upper trapezius: 15 units- 3 sites R upper trapezius: 15 units- 3 sits          L paraspinal: 10 units- 2 sites R paraspinal: 10 units- 2 sites  Face L frontalis(2 injection sites):10 units   R frontalis(2 injection sites):10 units         L corrugator: 5 units   R corrugator: 5 units           Procerus: 5 units   L temporalis: 20 units R temporalis: 20 units   Agent:  200 units of botulinum Type  A (Onobotulinum Toxin type A) was reconstituted with 4 ml of preservative free normal saline.  Time of reconstitution: At the time of the office visit (<30 minutes prior to injection)     Total injected (Units): 155  Total wasted (Units): 4  Patient tolerated procedure well without complications.   Reinjection is anticipated in 3 months. Return to clinic in 6 weeks.

## 2019-08-30 NOTE — Progress Notes (Signed)
NEUROLOGY FOLLOW UP OFFICE NOTE  Amy Yang [id number]  HISTORY OF PRESENT ILLNESS: Amy Yang is a 44 year old Caucasian woman whofollows up for migraines.  UPDATE: Status post 2 rounds of Botox.  Some headache improvement, but not significant.  No longer breastfeeding.    Severe, averaging 3 day duration.  Still with daily headache, about 15 days are migraine.  Frequency of pain relievers:  Daily Current NSAIDS:Advil 600mg  Current analgesics:Fioricet (takes 2 days a week at most) Current triptans:Treximet Current anti-emetic:Zofran ODT 4mg  Current muscle relaxants:none Current anti-anxiolytic:none Current sleep aide:none Current Antihypertensive medications:none Current Antidepressant medications:none Current Anticonvulsant medications:none Current anti-CGRP: None Current Vitamins/Herbal/Supplements:Magnesium, vitamin B6 Current Antihistamines/Decongestants:Benadryl Current hormone/birth control:  Mirena Other therapy:Acupuncture  Caffeine:Highly sensitive to caffeine and headache trigger.  Alcohol:no Smoker:no Diet:Needs to increase water intake Exercise:routine Depression/anxiety:okay Sleep hygiene:ok. Son sleeping through night.  HISTORY: Onset: Since 5th grade Location:Either side Quality:Throbbing/stabbing InitialIntensity:8/10 Aura:no Prodrome:no Associated symptoms:Nausea, photophobia. Some blurred vision. No vomiting. She has not had any new worse headache of her life, waking up from sleep InitialDuration:Up to 48 hours InitialFrequency:6 days a month of severe migraine but has daily headache InitialFrequency of abortive medication:only as needed. Triggers: Change in weather, hormonal/menstrual cycle, caffeine Relieving factors: Massage, ice, Tylenol, Bendaryl Activity:aggravates headache. Needs to lay down for severe migraines. She does have daily bi-temporal non-throbbing  vice-like headache as well.  Past NSAIDS:Ibuprofen, naproxen Past analgesics:Tramadol Past abortive triptans:Sumatriptan (tablet/NS/[place]), Relpax, Zomig. Treximet (most effective). Past anti-emetic: Reglan Past antihypertensive medications:labetolol (low blood pressure) Past antidepressant medications:Nortriptyline, venlafaxine Past anticonvulsant medications: Topiramate 100mg  (effectivebut side effects) Past vitamins/Herbal/Supplements:no Other past therapies: Botox(s/p 1 round. It helped. However, the cost went to her deductible and she couldn't afford it.), trigger point injections, biofeedback  Family history of headache: No  PAST MEDICAL HISTORY: Past Medical History:  Diagnosis Date  . Bronchitis   . Encounter for infertility   . Kidney stones   . Kidney stones   . Migraine   . Pneumonia   . Preterm labor     MEDICATIONS: Current Outpatient Medications on File Prior to Visit  Medication Sig Dispense Refill  . butalbital-acetaminophen-caffeine (FIORICET) 50-325-40 MG tablet TAKE 2 TABLETS BY MOUTH 3 (THREE) TIMES DAILY AS NEEDED. 20 tablet 3  . ibuprofen (ADVIL,MOTRIN) 600 MG tablet Take 1 tablet (600 mg total) by mouth every 6 (six) hours. (Patient taking differently: Take 800 mg by mouth every 6 (six) hours. ) 30 tablet 0  . ondansetron (ZOFRAN ODT) 4 MG disintegrating tablet Take 1 tablet (4 mg total) by mouth every 8 (eight) hours as needed for nausea or vomiting. 20 tablet 3  . Prenatal Vit-Fe Fumarate-FA (PRENATAL VITAMIN PO) Take 1 tablet by mouth daily.     . SUMAtriptan-naproxen (TREXIMET) 85-500 MG tablet Take 1 tablet by mouth every 2 (two) hours as needed for migraine. Maximum 2 tablets in 24 hours.  Hold breastfeeding for 12 hours after use. 10 tablet 2  . valACYclovir (VALTREX) 1000 MG tablet Take 1,000 mg by mouth 2 (two) times daily as needed.     No current facility-administered medications on file prior to visit.    ALLERGIES: Allergies    Allergen Reactions  . Latex   . Morphine And Related Nausea And Vomiting  . Pineapple   . Rhinocort [Budesonide]   . Symbicort [Budesonide-Formoterol Fumarate] Swelling    FAMILY HISTORY: Family History  Problem Relation Age of Onset  . Heart disease Father   . Asthma Sister   . Cancer  Maternal Grandmother   . Cancer Maternal Grandfather   . Cancer Paternal Grandmother   . Diabetes Paternal Grandmother   . Stroke Paternal Grandfather    SOCIAL HISTORY: Social History   Socioeconomic History  . Marital status: Married    Spouse name: Not on file  . Number of children: 2  . Years of education: Not on file  . Highest education level: Master's degree (e.g., MA, MS, MEng, MEd, MSW, MBA)  Occupational History  . Occupation: RECRUITER  Tobacco Use  . Smoking status: Never Smoker  . Smokeless tobacco: Never Used  Vaping Use  . Vaping Use: Never used  Substance and Sexual Activity  . Alcohol use: Yes    Comment: occasionally; socially  . Drug use: No  . Sexual activity: Yes  Other Topics Concern  . Not on file  Social History Narrative   Patient is right-handed. She lives with her husband and 2 children. She drinks hot tea 2 x a week. She works out the gym 3 x a week, and walks several times a week.   Social Determinants of Health   Financial Resource Strain:   . Difficulty of Paying Living Expenses:   Food Insecurity:   . Worried About Programme researcher, broadcasting/film/video in the Last Year:   . Barista in the Last Year:   Transportation Needs:   . Freight forwarder (Medical):   Marland Kitchen Lack of Transportation (Non-Medical):   Physical Activity:   . Days of Exercise per Week:   . Minutes of Exercise per Session:   Stress:   . Feeling of Stress :   Social Connections:   . Frequency of Communication with Friends and Family:   . Frequency of Social Gatherings with Friends and Family:   . Attends Religious Services:   . Active Member of Clubs or Organizations:   . Attends  Banker Meetings:   Marland Kitchen Marital Status:   Intimate Partner Violence:   . Fear of Current or Ex-Partner:   . Emotionally Abused:   Marland Kitchen Physically Abused:   . Sexually Abused:     PHYSICAL EXAM: Blood pressure (!) 116/91, pulse 79, height 5\' 7"  (1.702 m), weight 147 lb (66.7 kg), SpO2 97 %, unknown if currently breastfeeding. General: No acute distress.  Patient appears well-groomed.   Head:  Normocephalic/atraumatic Eyes:  Fundi examined but not visualized Neck: supple, no paraspinal tenderness, full range of motion Heart:  Regular rate and rhythm Lungs:  Clear to auscultation bilaterally Back: No paraspinal tenderness Neurological Exam: alert and oriented to person, place, and time. Attention span and concentration intact, recent and remote memory intact, fund of knowledge intact.  Speech fluent and not dysarthric, language intact.  CN II-XII intact. Bulk and tone normal, muscle strength 5/5 throughout.  Sensation to light touch, temperature and vibration intact.  Deep tendon reflexes 2+ throughout, toes downgoing.  Finger to nose and heel to shin testing intact.  Gait normal, Romberg negative.  IMPRESSION: Chronic migraine without aura, without status migrainosus, not intractable  PLAN: 1.  For preventative management, will start Trokdendi XR 25mg  at bedtime for a week, then 50mg  at bedtime.  Topiramate previously effective and ER may less likely cause cognitive disturbance.  Continue Botox 2.  For abortive therapy, she will try Ubrelvy 100mg . Zofran for nausea. 3.  Stop Fioricet.  Limit use of pain relievers to no more than 2 days out of week to prevent risk of rebound or medication-overuse headache. 4.  Keep headache diary 5.  Exercise, hydration, caffeine cessation, sleep hygiene, monitor for and avoid triggers 6.  Consider:  magnesium citrate 400mg  daily, riboflavin 400mg  daily, and coenzyme Q10 100mg  three times daily 7.  Follow up in 4 months.   Metta Clines, DO  CC:  Kelton Pillar, MD

## 2019-09-03 ENCOUNTER — Other Ambulatory Visit: Payer: Self-pay

## 2019-09-03 ENCOUNTER — Ambulatory Visit: Payer: Managed Care, Other (non HMO) | Admitting: Neurology

## 2019-09-03 ENCOUNTER — Encounter: Payer: Self-pay | Admitting: Neurology

## 2019-09-03 VITALS — BP 116/91 | HR 79 | Ht 67.0 in | Wt 147.0 lb

## 2019-09-03 DIAGNOSIS — G43709 Chronic migraine without aura, not intractable, without status migrainosus: Secondary | ICD-10-CM | POA: Diagnosis not present

## 2019-09-03 MED ORDER — TROKENDI XR 50 MG PO CP24: 50.0000 mg | ORAL_CAPSULE | Freq: Every day | ORAL | 11 refills | Status: DC

## 2019-09-03 NOTE — Patient Instructions (Addendum)
  1. Start Trokendi XR.  Take 25mg  at bedtime for a week, then 50mg  at bedtime.  Use copay card.  Continue BOTOX 2. Take Ubrelvy 100mg  at earliest onset of headache.  May repeat dose once in 2 hours if needed.  Maximum 2 tablets in 24 hours.  Contact me if you wish for a prescription and use copay card. 3. STOP FIORICET.  Limit use of pain relievers to no more than 2 days out of the week.  These medications include acetaminophen, NSAIDs (ibuprofen/Advil/Motrin, naproxen/Aleve, triptans (Imitrex/sumatriptan), Excedrin, and narcotics.  This will help reduce risk of rebound headaches. 4. Be aware of common food triggers:  - Caffeine:  coffee, black tea, cola, Mt. Dew  - Chocolate  - Dairy:  aged cheeses (brie, blue, cheddar, gouda, [place], provolone, [place], Swiss, etc), chocolate milk, buttermilk, sour cream, limit eggs and yogurt  - Nuts, peanut butter  - Alcohol  - Cereals/grains:  FRESH breads (fresh bagels, sourdough, doughnuts), yeast productions  - Processed/canned/aged/cured meats (pre-packaged deli meats, hotdogs)  - MSG/glutamate:  soy sauce, flavor enhancer, pickled/preserved/marinated foods  - Sweeteners:  aspartame (Equal, Nutrasweet).  Sugar and Splenda are okay  - Vegetables:  legumes (lima beans, lentils, snow peas, fava beans, pinto peans, peas, garbanzo beans), sauerkraut, onions, olives, pickles  - Fruit:  avocados, bananas, citrus fruit (orange, lemon, grapefruit), mango  - Other:  Frozen meals, macaroni and cheese 5. Routine exercise 6. Stay adequately hydrated (aim for 64 oz water daily) 7. Keep headache diary 8. Maintain proper stress management 9. Maintain proper sleep hygiene 10. Do not skip meals 11. Consider supplements:  magnesium citrate 400mg  daily, riboflavin 400mg  daily, coenzyme Q10 100mg  three times daily.

## 2019-09-18 ENCOUNTER — Emergency Department (HOSPITAL_COMMUNITY)
Admission: EM | Admit: 2019-09-18 | Discharge: 2019-09-18 | Disposition: A | Discharge status: Home / Self Care | Payer: Managed Care, Other (non HMO) | Attending: Emergency Medicine | Admitting: Emergency Medicine

## 2019-09-18 ENCOUNTER — Encounter (HOSPITAL_COMMUNITY): Payer: Self-pay | Admitting: Emergency Medicine

## 2019-09-18 ENCOUNTER — Emergency Department (HOSPITAL_COMMUNITY): Payer: Managed Care, Other (non HMO)

## 2019-09-18 DIAGNOSIS — R05 Cough: Secondary | ICD-10-CM | POA: Insufficient documentation

## 2019-09-18 DIAGNOSIS — Z79899 Other long term (current) drug therapy: Secondary | ICD-10-CM | POA: Diagnosis not present

## 2019-09-18 DIAGNOSIS — Z91040 Latex allergy status: Secondary | ICD-10-CM | POA: Insufficient documentation

## 2019-09-18 DIAGNOSIS — R059 Cough, unspecified: Secondary | ICD-10-CM

## 2019-09-18 DIAGNOSIS — R0602 Shortness of breath: Secondary | ICD-10-CM | POA: Diagnosis not present

## 2019-09-18 LAB — CBC
HCT: 43.2 % (ref 36.0–46.0)
Hemoglobin: 14.8 g/dL (ref 12.0–15.0)
MCH: 32.2 pg (ref 26.0–34.0)
MCHC: 34.3 g/dL (ref 30.0–36.0)
MCV: 93.9 fL (ref 80.0–100.0)
Platelets: 282 10*3/uL (ref 150–400)
RBC: 4.6 MIL/uL (ref 3.87–5.11)
RDW: 12.5 % (ref 11.5–15.5)
WBC: 6.3 10*3/uL (ref 4.0–10.5)
nRBC: 0 % (ref 0.0–0.2)

## 2019-09-18 LAB — I-STAT BETA HCG BLOOD, ED (MC, WL, AP ONLY): I-stat hCG, quantitative: <5 m[IU]/mL (ref <5)

## 2019-09-18 LAB — BASIC METABOLIC PANEL
Anion gap: 10 (ref 5–15)
BUN: 14 mg/dL (ref 6–20)
CO2: 23 mmol/L (ref 22–32)
Calcium: 9.5 mg/dL (ref 8.9–10.3)
Chloride: 106 mmol/L (ref 98–111)
Creatinine, Ser: 1.04 mg/dL — ABNORMAL HIGH (ref 0.44–1.00)
GFR calc Af Amer: >60 mL/min (ref >60)
GFR calc non Af Amer: >60 mL/min (ref >60)
Glucose, Bld: 100 mg/dL — ABNORMAL HIGH (ref 70–99)
Potassium: 4.3 mmol/L (ref 3.5–5.1)
Sodium: 139 mmol/L (ref 135–145)

## 2019-09-18 LAB — TROPONIN I (HIGH SENSITIVITY): Troponin I (High Sensitivity): <2 ng/L (ref <18)

## 2019-09-18 MED ORDER — PREDNISONE 20 MG PO TABS
20.0000 mg | ORAL_TABLET | Freq: Once | ORAL | Status: AC
  Administered 2019-09-18: 20 mg via ORAL
  Filled 2019-09-18: qty 1

## 2019-09-18 MED ORDER — PREDNISONE 20 MG PO TABS: 20.0000 mg | ORAL_TABLET | Freq: Every day | ORAL | 0 refills | Status: AC

## 2019-09-18 MED ORDER — ALBUTEROL SULFATE HFA 108 (90 BASE) MCG/ACT IN AERS
2.0000 | INHALATION_SPRAY | Freq: Once | RESPIRATORY_TRACT | Status: AC
  Administered 2019-09-18: 2 via RESPIRATORY_TRACT
  Filled 2019-09-18: qty 6.7

## 2019-09-18 MED ORDER — SODIUM CHLORIDE 0.9% FLUSH: 3.0000 mL | Freq: Once | INTRAVENOUS | Status: DC

## 2019-09-18 MED ORDER — PREDNISONE 10 MG PO TABS
20.0000 mg | ORAL_TABLET | Freq: Every day | ORAL | 0 refills | Status: DC
Start: 2019-09-18 — End: 2019-09-18

## 2019-09-18 NOTE — Discharge Instructions (Signed)
You have been seen here for cough, your labs and imaging all look reassuring.  I prescribed you prednisone, please take as prescribed. I recommend that you take this in the morning as it can make you feel anxious and keep you up at night.  I recommend that you use your inhaler when you feel tightness in your chest, you can take 1 to 2 puffs every 20 minutes up to 3 times.  After that I want you to wait 3 to 4 hours as it can increase your heart rate and make you feel anxious.  You may also take Claritin as this can help decrease inflammation.  I want you to follow-up with your primary care doctor for further evaluation management.  I want to come back to emergency department if you develop chest pain, shortness of breath, uncontrolled nausea, vomiting, diarrhea, fever as the symptoms require further evaluation management.

## 2019-09-18 NOTE — ED Provider Notes (Signed)
[facility] EMERGENCY DEPARTMENT Provider Note   CSN: [id number] Arrival date & time: 09/18/19  1026     History Chief Complaint  Patient presents with   Shortness of Breath    Amy Yang is a 44 y.o. female.  HPI   Patient presents to the emergency department with chief complaint of a coughing fit that started last night.  Patient states she started cough for 3 hours last night and felt like her throat was closing up. Patient tried taking her inhaler without any relief and felt like it did not work because it was old.  She admits to feeling lightheaded, difficulty breathing, headache, but denies chest pain, nausea, vomiting or becoming diaphoretic.  Today her cough has been  episodic and becomes worse when she is talking and denies any alleviating factors.  Patient explains this feels like when she had a allergic reaction to poison oak but she denies any exposure, rashes, itchiness, fever, chills, sick contacts, admits that she has had her Covid vaccine.  Patient has significant medical history of bronchitis and migraines.  Patient denies smoking, fever, chills, nasal congestion, chest pain, domino pain, nausea, vomiting, diarrhea, dysuria, pedal edema.  Past Medical History:  Diagnosis Date   Bronchitis    Encounter for infertility    Kidney stones    Kidney stones    Migraine    Pneumonia    Preterm labor     Patient Active Problem List   Diagnosis Date Noted   PP SVD (9/10) PEC 11/17/2017   Preeclampsia 11/17/2017   Indication for care in labor or delivery 11/14/2017    Past Surgical History:  Procedure Laterality Date   KIDNEY STONE SURGERY     WISDOM TOOTH EXTRACTION       OB History    Gravida  2   Para  2   Term  2   Preterm  0   AB  0   Living  2     SAB  0   TAB  0   Ectopic  0   Multiple  0   Live Births  2           Family History  Problem Relation Age of Onset   Heart disease Father    Asthma  Sister    Cancer Maternal Grandmother    Cancer Maternal Grandfather    Cancer Paternal Grandmother    Diabetes Paternal Grandmother    Stroke Paternal Grandfather     Social History   Tobacco Use   Smoking status: Never Smoker   Smokeless tobacco: Never Used  Building services engineer Use: Never used  Substance Use Topics   Alcohol use: Yes    Comment: occasionally; socially   Drug use: No    Home Medications Prior to Admission medications   Medication Sig Start Date End Date Taking? Authorizing Provider  butalbital-acetaminophen-caffeine (FIORICET) 50-325-40 MG tablet TAKE 2 TABLETS BY MOUTH 3 (THREE) TIMES DAILY AS NEEDED. 04/27/19   Everlena Cooper, Adam R, DO  ibuprofen (ADVIL,MOTRIN) 600 MG tablet Take 1 tablet (600 mg total) by mouth every 6 (six) hours. Patient taking differently: Take 800 mg by mouth every 6 (six) hours.  11/19/17   Lawhorn, Vanessa L[place]asas, CNM  ondansetron (ZOFRAN ODT) 4 MG disintegrating tablet Take 1 tablet (4 mg total) by mouth every 8 (eight) hours as needed for nausea or vomiting. 09/27/18   Everlena Cooper, Adam R, DO  predniSONE (DELTASONE) 20 MG tablet Take 1  tablet (20 mg total) by mouth daily for 6 days. 09/18/19 09/24/19  Carroll Sage, PA-C  Prenatal Vit-Fe Fumarate-FA (PRENATAL VITAMIN PO) Take 1 tablet by mouth daily.     [provider]  SUMAtriptan-naproxen (TREXIMET) 85-500 MG tablet Take 1 tablet by mouth every 2 (two) hours as needed for migraine. Maximum 2 tablets in 24 hours.  Hold breastfeeding for 12 hours after use. 02/08/19   Drema Dallas, DO  Topiramate ER (TROKENDI XR) 50 MG CP24 Take 50 mg by mouth at bedtime. 09/03/19   Drema Dallas, DO  valACYclovir (VALTREX) 1000 MG tablet Take 1,000 mg by mouth 2 (two) times daily as needed.    [provider]    Allergies    Latex, Morphine and related, Pineapple, Rhinocort [budesonide], and Symbicort [budesonide-formoterol fumarate]  Review of Systems   Review of Systems    Constitutional: Negative for chills and fever.  HENT: Negative for congestion, postnasal drip, rhinorrhea and sneezing.        Patient admits that her throat felt like it was closing up.  Eyes: Negative for visual disturbance.  Respiratory: Positive for cough. Negative for shortness of breath.   Cardiovascular: Negative for chest pain and leg swelling.  Gastrointestinal: Negative for abdominal pain, diarrhea, nausea and vomiting.  Genitourinary: Negative for enuresis.  Musculoskeletal: Negative for back pain.  Skin: Negative for rash.  Neurological: Positive for headaches. Negative for dizziness.  Hematological: Does not bruise/bleed easily.    Physical Exam Updated Vital Signs BP 105/75 (BP Location: Left Arm)    Pulse 73    Temp 98.6 F (37 C) (Oral)    Resp 18    SpO2 100%   Physical Exam Vitals and nursing note reviewed.  Constitutional:      General: She is not in acute distress.    Appearance: She is not ill-appearing.  HENT:     Head: Normocephalic and atraumatic.     Nose: No congestion.     Mouth/Throat:     Mouth: Mucous membranes are moist.     Pharynx: Oropharynx is clear. No oropharyngeal exudate or posterior oropharyngeal erythema.  Eyes:     General: No scleral icterus. Cardiovascular:     Rate and Rhythm: Normal rate and regular rhythm.     Pulses: Normal pulses.     Heart sounds: No murmur heard.  No friction rub. No gallop.   Pulmonary:     Effort: No respiratory distress.     Breath sounds: No wheezing, rhonchi or rales.  Abdominal:     General: There is no distension.     Palpations: There is no mass.     Tenderness: There is no abdominal tenderness. There is no guarding.  Musculoskeletal:        General: No swelling.     Cervical back: No rigidity.     Right lower leg: No edema.     Left lower leg: No edema.  Skin:    General: Skin is warm and dry.     Capillary Refill: Capillary refill takes less than 2 seconds.     Findings: No rash.   Neurological:     Mental Status: She is alert and oriented to person, place, and time.  Psychiatric:        Mood and Affect: Mood normal.     ED Results / Procedures / Treatments   Labs (all labs ordered are listed, but only abnormal results are displayed) Labs Reviewed  BASIC METABOLIC PANEL -  Abnormal; Notable for the following components:      Result Value   Glucose, Bld 100 (*)    Creatinine, Ser 1.04 (*)    All other components within normal limits  CBC  I-STAT BETA HCG BLOOD, ED ([facility], [facility], [facility] ONLY)  TROPONIN I (HIGH SENSITIVITY)  TROPONIN I (HIGH SENSITIVITY)    EKG EKG Interpretation  Date/Time:  Tuesday September 18 2019 10:37:54 EDT Ventricular Rate:  71 PR Interval:  162 QRS Duration: 78 QT Interval:  404 QTC Calculation: 439 R Axis:   95 Text Interpretation: Normal sinus rhythm Rightward axis Borderline ECG Confirmed by Margarita Grizzle [phone]) on 09/18/2019 2:07:55 PM   Radiology DG Chest 2 View  Result Date: 09/18/2019 CLINICAL DATA:  Shortness of breath, cough. EXAM: CHEST - 2 VIEW COMPARISON:  None. FINDINGS: The heart size and mediastinal contours are within normal limits. Both lungs are clear. No pneumothorax or pleural effusion is noted. The visualized skeletal structures are unremarkable. IMPRESSION: No active cardiopulmonary disease. Electronically Signed   By: Lupita Raider M.D.   On: 09/18/2019 10:53    Procedures Procedures (including critical care time)  Medications Ordered in ED Medications  sodium chloride flush (NS) 0.9 % injection 3 mL (has no administration in time range)  predniSONE (DELTASONE) tablet 20 mg (20 mg Oral Given 09/18/19 2141)  albuterol (VENTOLIN HFA) 108 (90 Base) MCG/ACT inhaler 2 puff (2 puffs Inhalation Given 09/18/19 2141)    ED Course  I have reviewed the triage vital signs and the nursing notes.  Pertinent labs & imaging results that were available during my care of the patient were reviewed by me and considered in my  medical decision making (see chart for details).    MDM Rules/Calculators/A&P                          I have personally reviewed all imaging, labs and have interpreted them.  Due to patient complaint most concern for asthma exacerbation versus pneumonia versus cardiac abnormality.  Unlikely patient suffering from cardiac abnormality as troponin was less than 2, EKG showed sinus rhythm without signs of ischemia patient is not currently having chest pain, possible patient had some chest discomfort due to frequent coughing.  Unlikely patient suffering from pneumonia as chest x-ray came back unremarkable showing no signs of infiltrates, edema, consolidation, widened mediastinum, lung sounds were clear bilaterally no wheezing or rhonchi heard.  Unlikely patient suffering from a metabolic abnormality as BMP does not show any electrode abnormality.  CBC did not show leukocytosis or anemia.  Patient was given 2 puffs of albuterol and prednisone for treatment reactive airway.  Patient was reassessed and she states that she is feeling much better, states she does not feel like she has a cough and she feels like her chest does not feel as tight.  Lungs were reassessed clear bilaterally no wheezing or stridor heard good air movement heard bilaterally.    Patient appears to be resting comfortably in bed showing no acute signs distress.  Vital signs remained stable does not meet criteria to be admitted to the hospital.  Likely patient suffered acute exacerbation of bronchitis and patient will be prescribed prednisone and instructed to follow-up with her primary care doctor for further evaluation management.  Patient was discussed with attending who agrees assessment plan.  Patient was given at home instructions as well as strict return precautions.  Patient verbalized that she understood and agree with said plan.  Final Clinical Impression(s) / ED Diagnoses Final diagnoses:  Cough    Rx / DC Orders ED Discharge  Orders         Ordered    predniSONE (DELTASONE) 10 MG tablet  Daily,   Status:  Discontinued     Reprint     09/18/19 2236    predniSONE (DELTASONE) 20 MG tablet  Daily     Discontinue  Reprint     09/18/19 2245           Carroll SageFaulkner, Daneka Lantigua J, PA-C 09/18/19 2328    Melene PlanFloyd, Dan, DO 09/18/19 2345

## 2019-09-18 NOTE — ED Triage Notes (Addendum)
Pt c/o "coughing attacks" and periods of trouble catching her breath as well as some chest pain. Pt able to speak in complete sentences, however, at times will cough and gasp for air. O2 sat WNL at triage. States she used inhaler last night but thinks it is old so did not get any relief. Reports this feels similar to an allergic reaction she has had before, however, symptoms come and go.

## 2019-09-18 NOTE — ED Notes (Signed)
Pt verbalized understanding of d/c instructions, follow up care and s/s requiring return to ed. Pt did not have any additional questions and was transported via wheelchair to exit.

## 2019-10-02 ENCOUNTER — Other Ambulatory Visit: Payer: Self-pay | Admitting: Neurology

## 2019-10-02 ENCOUNTER — Telehealth: Payer: Self-pay | Admitting: Neurology

## 2019-10-02 MED ORDER — UBRELVY 100 MG PO TABS
1.0000 | ORAL_TABLET | ORAL | 5 refills | Status: DC | PRN
Start: 2019-10-02 — End: 2020-05-30

## 2019-10-02 NOTE — Telephone Encounter (Signed)
Patient was calling to update that the sample ubrelvy was helpful and is requesting a prescription be sent in to [facility] on [address]. Also wanted to add that the topamax has been working great for her.

## 2019-10-02 NOTE — Telephone Encounter (Signed)
Done

## 2019-10-19 ENCOUNTER — Ambulatory Visit (INDEPENDENT_AMBULATORY_CARE_PROVIDER_SITE_OTHER): Payer: Managed Care, Other (non HMO) | Admitting: Neurology

## 2019-10-19 ENCOUNTER — Other Ambulatory Visit: Payer: Self-pay

## 2019-10-19 ENCOUNTER — Ambulatory Visit: Payer: Managed Care, Other (non HMO) | Admitting: Neurology

## 2019-10-19 DIAGNOSIS — G43709 Chronic migraine without aura, not intractable, without status migrainosus: Secondary | ICD-10-CM | POA: Diagnosis not present

## 2019-10-19 MED ORDER — NURTEC 75 MG PO TBDP: ORAL_TABLET | ORAL | 0 refills | Status: DC

## 2019-10-19 MED ORDER — ONABOTULINUMTOXINA 100 UNITS IJ SOLR
155.0000 [IU] | Freq: Once | INTRAMUSCULAR | Status: AC
  Administered 2019-10-19: 155 [IU] via INTRAMUSCULAR

## 2019-10-19 NOTE — Progress Notes (Signed)
[facility]   Procedure Note Botox  Attending: Dr. Zacarias Krauter  Preoperative Diagnosis(es): Chronic migraine  Consent obtained from: The patient Benefits discussed included, but were not limited to decreased muscle tightness, increased joint range of motion, and decreased pain.  Risk discussed included, but were not limited pain and discomfort, bleeding, bruising, excessive weakness, venous thrombosis, muscle atrophy and dysphagia.  Anticipated outcomes of the procedure as well as he risks and benefits of the alternatives to the procedure, and the roles and tasks of the personnel to be involved, were discussed with the patient, and the patient consents to the procedure and agrees to proceed. A copy of the patient medication guide was given to the patient which explains the blackbox warning.  Patients identity and treatment sites confirmed Yes.  .  Details of Procedure: Skin was cleaned with alcohol. Prior to injection, the needle plunger was aspirated to make sure the needle was not within a blood vessel.  There was no blood retrieved on aspiration.    Following is a summary of the muscles injected  And the amount of Botulinum toxin used:  Dilution 200 units of Botox was reconstituted with 4 ml of preservative free normal saline. Time of reconstitution: At the time of the office visit (<30 minutes prior to injection)   Injections  155 total units of Botox was injected with a 30 gauge needle.  Injection Sites: L occipitalis: 15 units- 3 sites  R occiptalis: 15 units- 3 sites  L upper trapezius: 15 units- 3 sites R upper trapezius: 15 units- 3 sits          L paraspinal: 10 units- 2 sites R paraspinal: 10 units- 2 sites  Face L frontalis(2 injection sites):10 units   R frontalis(2 injection sites):10 units         L corrugator: 5 units   R corrugator: 5 units           Procerus: 5 units   L temporalis: 20 units R temporalis: 20 units   Agent:  200 units of botulinum Type  A (Onobotulinum Toxin type A) was reconstituted with 4 ml of preservative free normal saline.  Time of reconstitution: At the time of the office visit (<30 minutes prior to injection)     Total injected (Units): 155  Total wasted (Units): none wasted  Patient tolerated procedure well without complications.   Reinjection is anticipated in 3 months.    

## 2019-11-06 ENCOUNTER — Other Ambulatory Visit (HOSPITAL_COMMUNITY): Payer: Self-pay | Admitting: Family Medicine

## 2019-11-06 DIAGNOSIS — R0609 Other forms of dyspnea: Secondary | ICD-10-CM

## 2019-11-08 ENCOUNTER — Other Ambulatory Visit (HOSPITAL_COMMUNITY): Payer: Self-pay | Admitting: Radiology

## 2019-11-08 DIAGNOSIS — R0600 Dyspnea, unspecified: Secondary | ICD-10-CM

## 2019-11-08 DIAGNOSIS — R0609 Other forms of dyspnea: Secondary | ICD-10-CM

## 2019-11-22 ENCOUNTER — Other Ambulatory Visit (HOSPITAL_COMMUNITY): Payer: Managed Care, Other (non HMO)

## 2019-11-23 ENCOUNTER — Other Ambulatory Visit: Payer: Self-pay | Admitting: Oncology

## 2019-11-23 ENCOUNTER — Encounter: Payer: Self-pay | Admitting: Oncology

## 2019-11-23 ENCOUNTER — Ambulatory Visit (HOSPITAL_COMMUNITY)
Admission: RE | Admit: 2019-11-23 | Discharge: 2019-11-23 | Disposition: A | Discharge status: Home / Self Care | Payer: Managed Care, Other (non HMO) | Source: Ambulatory Visit | Attending: Pulmonary Disease | Admitting: Pulmonary Disease

## 2019-11-23 DIAGNOSIS — U071 COVID-19: Secondary | ICD-10-CM | POA: Diagnosis not present

## 2019-11-23 MED ORDER — DIPHENHYDRAMINE HCL 50 MG/ML IJ SOLN: 50.0000 mg | Freq: Once | INTRAMUSCULAR | Status: DC | PRN

## 2019-11-23 MED ORDER — SODIUM CHLORIDE 0.9 % IV SOLN
1200.0000 mg | Freq: Once | INTRAVENOUS | Status: AC
  Administered 2019-11-23: 1200 mg via INTRAVENOUS

## 2019-11-23 MED ORDER — FAMOTIDINE IN NACL 20-0.9 MG/50ML-% IV SOLN: 20.0000 mg | Freq: Once | INTRAVENOUS | Status: DC | PRN

## 2019-11-23 MED ORDER — METHYLPREDNISOLONE SODIUM SUCC 125 MG IJ SOLR: 125.0000 mg | Freq: Once | INTRAMUSCULAR | Status: DC | PRN

## 2019-11-23 MED ORDER — ALBUTEROL SULFATE HFA 108 (90 BASE) MCG/ACT IN AERS: 2.0000 | INHALATION_SPRAY | Freq: Once | RESPIRATORY_TRACT | Status: DC | PRN

## 2019-11-23 MED ORDER — SODIUM CHLORIDE 0.9 % IV SOLN: INTRAVENOUS | Status: DC | PRN

## 2019-11-23 MED ORDER — EPINEPHRINE 0.3 MG/0.3ML IJ SOAJ: 0.3000 mg | Freq: Once | INTRAMUSCULAR | Status: DC | PRN

## 2019-11-23 NOTE — Discharge Instructions (Signed)

## 2019-11-23 NOTE — Progress Notes (Signed)
  Diagnosis: COVID-19  Physician:dr wright  Procedure: [facility] Med: casirivimab\imdevimab infusion - Provided patient with casirivimab\imdevimab fact sheet for patients, parents and caregivers prior to infusion.  Complications: No immediate complications noted.  Discharge: Discharged home   [name] [name] [name] 11/23/2019  

## 2019-11-23 NOTE — Progress Notes (Signed)
I connected by phone with  Mrs. Ballon to discuss the potential use of an new treatment for mild to moderate COVID-19 viral infection in non-hospitalized patients.   This patient is a age/sex that meets the FDA criteria for Emergency Use Authorization of casirivimab\imdevimab.  Has a (+) direct SARS-CoV-2 viral test result 1. Has mild or moderate COVID-19  2. Is ? 42 years of age and weighs ? 40 kg 3. Is NOT hospitalized due to COVID-19 4. Is NOT requiring oxygen therapy or requiring an increase in baseline oxygen flow rate due to COVID-19 5. Is within 10 days of symptom onset 6. Has at least one of the high risk factor(s) for progression to severe COVID-19 and/or hospitalization as defined in EUA. Specific high risk criteria : Past Medical History:  Diagnosis Date  . Bronchitis   . Encounter for infertility   . Kidney stones   . Kidney stones   . Migraine   . Pneumonia   . Preterm labor   ?  ?    Symptom onset  11/14/2019   I have spoken and communicated the following to the patient or parent/caregiver:   1. FDA has authorized the emergency use of casirivimab\imdevimab for the treatment of mild to moderate COVID-19 in adults and pediatric patients with positive results of direct SARS-CoV-2 viral testing who are 60 years of age and older weighing at least 40 kg, and who are at high risk for progressing to severe COVID-19 and/or hospitalization.   2. The significant known and potential risks and benefits of casirivimab\imdevimab, and the extent to which such potential risks and benefits are unknown.   3. Information on available alternative treatments and the risks and benefits of those alternatives, including clinical trials.   4. Patients treated with casirivimab\imdevimab should continue to self-isolate and use infection control measures (e.g., wear mask, isolate, social distance, avoid sharing personal items, clean and disinfect "high touch" surfaces, and frequent handwashing)  according to CDC guidelines.    5. The patient or parent/caregiver has the option to accept or refuse casirivimab\imdevimab .   After reviewing this information with the patient, The patient agreed to proceed with receiving casirivimab\imdevimab infusion and will be provided a copy of the Fact sheet prior to receiving the infusion.Mignon Pine, AGNP-C [phone] (Infusion Center Hotline)

## 2019-12-06 ENCOUNTER — Other Ambulatory Visit: Payer: Self-pay

## 2019-12-06 ENCOUNTER — Ambulatory Visit (HOSPITAL_COMMUNITY): Payer: Managed Care, Other (non HMO) | Attending: Cardiology

## 2019-12-06 DIAGNOSIS — R0609 Other forms of dyspnea: Secondary | ICD-10-CM | POA: Diagnosis not present

## 2019-12-06 LAB — ECHOCARDIOGRAM COMPLETE
Area-P 1/2: 3.53 cm2
S' Lateral: 3.1 cm

## 2019-12-10 ENCOUNTER — Inpatient Hospital Stay (HOSPITAL_COMMUNITY)
Admission: RE | Admit: 2019-12-10 | Discharge: 2019-12-10 | Disposition: A | Discharge status: Home / Self Care | Payer: Managed Care, Other (non HMO) | Source: Ambulatory Visit

## 2019-12-10 NOTE — Progress Notes (Signed)
Pt not tested for covid today due to pt testing + for covid on 11/19/19. Results are located in Care Everywhere. Based on the guidelines the pt is in the 90 day window to not retest. The pt is still expected to quarantine until their procedure. Therefore, the pt can still have the scheduled procedure. These are the guidelines as follows:  Guidance: Patient previously tested + COVID; now past 90 day window seeking elective surgery (asymptomatic)  Retest patient If negative, proceed with surgery If positive, postpone surgery for 10 days from positive test Patient to quarantine for the (10 days) Do not retest again prior to surgery (even if scheduled a couple of weeks out) Use standard precautions for surgery

## 2019-12-13 ENCOUNTER — Other Ambulatory Visit: Payer: Self-pay

## 2019-12-13 ENCOUNTER — Encounter: Payer: Self-pay | Admitting: Neurology

## 2019-12-13 ENCOUNTER — Ambulatory Visit (HOSPITAL_COMMUNITY)
Admission: RE | Admit: 2019-12-13 | Discharge: 2019-12-13 | Disposition: A | Discharge status: Home / Self Care | Payer: Managed Care, Other (non HMO) | Source: Ambulatory Visit | Attending: Family Medicine | Admitting: Family Medicine

## 2019-12-13 DIAGNOSIS — R0600 Dyspnea, unspecified: Secondary | ICD-10-CM | POA: Insufficient documentation

## 2019-12-13 LAB — PULMONARY FUNCTION TEST
DL/VA % pred: 113 %
DL/VA: 4.9 ml/min/mmHg/L
DLCO unc % pred: 106 %
DLCO unc: 25.54 ml/min/mmHg
FEF 25-75 Post: 3.39 L/sec
FEF 25-75 Pre: 2.93 L/sec
FEF2575-%Change-Post: 15 %
FEF2575-%Pred-Post: 104 %
FEF2575-%Pred-Pre: 90 %
FEV1-%Change-Post: 3 %
FEV1-%Pred-Post: 100 %
FEV1-%Pred-Pre: 97 %
FEV1-Post: 3.29 L
FEV1-Pre: 3.18 L
FEV1FVC-%Change-Post: 1 %
FEV1FVC-%Pred-Pre: 96 %
FEV6-%Change-Post: 0 %
FEV6-%Pred-Post: 101 %
FEV6-%Pred-Pre: 101 %
FEV6-Post: 4.04 L
FEV6-Pre: 4.01 L
FEV6FVC-%Change-Post: 0 %
FEV6FVC-%Pred-Post: 102 %
FEV6FVC-%Pred-Pre: 102 %
FVC-%Change-Post: 2 %
FVC-%Pred-Post: 101 %
FVC-%Pred-Pre: 99 %
FVC-Post: 4.1 L
FVC-Pre: 4.01 L
Post FEV1/FVC ratio: 80 %
Post FEV6/FVC ratio: 100 %
Pre FEV1/FVC ratio: 79 %
Pre FEV6/FVC Ratio: 100 %
RV % pred: 110 %
RV: 1.96 L
TLC % pred: 105 %
TLC: 5.8 L

## 2019-12-13 MED ORDER — ALBUTEROL SULFATE (2.5 MG/3ML) 0.083% IN NEBU
2.5000 mg | INHALATION_SOLUTION | Freq: Once | RESPIRATORY_TRACT | Status: AC
  Administered 2019-12-13: 2.5 mg via RESPIRATORY_TRACT

## 2019-12-13 NOTE — Progress Notes (Signed)
Patient already has valid authorization on file for the Botox. Valid until 04/09/20.   BV states that [facility] is her SP; called them at [phone] to set up acct for patients 11/12 appt. Gave verbal script as well. Awaiting patient consent so we can schedule delivery.

## 2019-12-20 ENCOUNTER — Other Ambulatory Visit: Payer: Self-pay | Admitting: Neurology

## 2020-01-18 ENCOUNTER — Other Ambulatory Visit: Payer: Self-pay

## 2020-01-18 ENCOUNTER — Ambulatory Visit (INDEPENDENT_AMBULATORY_CARE_PROVIDER_SITE_OTHER): Payer: Managed Care, Other (non HMO) | Admitting: Neurology

## 2020-01-18 DIAGNOSIS — G43709 Chronic migraine without aura, not intractable, without status migrainosus: Secondary | ICD-10-CM

## 2020-01-18 MED ORDER — ONABOTULINUMTOXINA 100 UNITS IJ SOLR
200.0000 [IU] | Freq: Once | INTRAMUSCULAR | Status: AC
  Administered 2020-01-18: 155 [IU] via INTRAMUSCULAR

## 2020-01-18 NOTE — Progress Notes (Signed)
[facility]  ° °Procedure Note Botox ° °Attending: Dr. Adabelle Griffiths ° °Preoperative Diagnosis(es): Chronic migraine ° °Consent obtained from: The patient °Benefits discussed included, but were not limited to decreased muscle tightness, increased joint range of motion, and decreased pain.  Risk discussed included, but were not limited pain and discomfort, bleeding, bruising, excessive weakness, venous thrombosis, muscle atrophy and dysphagia.  Anticipated outcomes of the procedure as well as he risks and benefits of the alternatives to the procedure, and the roles and tasks of the personnel to be involved, were discussed with the patient, and the patient consents to the procedure and agrees to proceed. A copy of the patient medication guide was given to the patient which explains the blackbox warning. ° °Patients identity and treatment sites confirmed Yes.  . ° °Details of Procedure: °Skin was cleaned with alcohol. Prior to injection, the needle plunger was aspirated to make sure the needle was not within a blood vessel.  There was no blood retrieved on aspiration.   ° °Following is a summary of the muscles injected  And the amount of Botulinum toxin used: ° °Dilution °200 units of Botox was reconstituted with 4 ml of preservative free normal saline. °Time of reconstitution: At the time of the office visit (<30 minutes prior to injection)  ° °Injections  °155 total units of Botox was injected with a 30 gauge needle. ° °Injection Sites: °L occipitalis: 15 units- 3 sites  °R occiptalis: 15 units- 3 sites ° °L upper trapezius: 15 units- 3 sites °R upper trapezius: 15 units- 3 sits          °L paraspinal: 10 units- 2 sites °R paraspinal: 10 units- 2 sites ° °Face °L frontalis(2 injection sites):10 units   °R frontalis(2 injection sites):10 units         °L corrugator: 5 units   °R corrugator: 5 units           °Procerus: 5 units   °L temporalis: 20 units °R temporalis: 20 units  ° °Agent:  °200 units of botulinum Type  A (Onobotulinum Toxin type A) was reconstituted with 4 ml of preservative free normal saline.  °Time of reconstitution: At the time of the office visit (<30 minutes prior to injection)  ° ° ° Total injected (Units):  155 ° Total wasted (Units):  45 ° °Patient tolerated procedure well without complications.   °Reinjection is anticipated in 3 months. ° ° °

## 2020-01-29 ENCOUNTER — Ambulatory Visit: Payer: Managed Care, Other (non HMO) | Admitting: Neurology

## 2020-02-04 NOTE — Progress Notes (Signed)
NEUROLOGY FOLLOW UP OFFICE NOTE  Amy Yang [id number]   Subjective:  Amy Yang is a 44year old Caucasian woman whofollows up for migraines.  UPDATE: In addition to Botox, started Trokendi XR in June.  Still a daily headache, but dull.  First 2 months, she averages 4 migraines a month.  The third month prior to next Botox, they occur twice a week.  With Bernita Raisin, would typically last 1 hour but third month she needs to repeat dose.  Current NSAIDS:Advil 600mg  Current analgesics:Fioricet (takes 2 days a week at most) Current triptans:Treximet Current anti-emetic:Zofran ODT 4mg  Current muscle relaxants:none Current anti-anxiolytic:none Current sleep aide:none Current Antihypertensive medications:none Current Antidepressant medications:none Current Anticonvulsant medications:Trokendi XR 50mg  at bedtime Current anti-CGRP: Ubrelvy 100mg  Current Vitamins/Herbal/Supplements:Magnesium, vitamin B6 Current Antihistamines/Decongestants:Benadryl Current hormone/birth control: Mirena Other therapy:Acupuncture  Caffeine:Highly sensitive to caffeine and headache trigger.  Alcohol:no Smoker:no Diet:Needs to increase water intake Exercise:routine Depression/anxiety:okay Sleep hygiene:ok. Son sleeping through night.  HISTORY: Onset: Since 5th grade Location:Either side Quality:Throbbing/stabbing InitialIntensity:8/10 Aura:no Prodrome:no Associated symptoms:Nausea, photophobia. Some blurred vision. No vomiting. She has not had any new worse headache of her life, waking up from sleep InitialDuration:Up to 48 hours InitialFrequency:6 days a month of severe migraine but has daily headache InitialFrequency of abortive medication:only as needed. Triggers: Change in weather, hormonal/menstrual cycle, caffeine Relieving factors: Massage, ice, Tylenol, Bendaryl Activity:aggravates headache. Needs to lay down for  severe migraines. She does have daily bi-temporal non-throbbing vice-like headache as well.  Past NSAIDS:Ibuprofen, naproxen Past analgesics:Tramadol Past abortive triptans:Sumatriptan (tablet/NS/[place]), Relpax, Zomig. Treximet(mosteffective). Past anti-emetic: Reglan Past antihypertensive medications:labetolol (low blood pressure) Past antidepressant medications:Nortriptyline, venlafaxine Past anticonvulsant medications: Topiramate 100mg  (effectivebut side effects) Past CGRP inhibitor:  none Past vitamins/Herbal/Supplements:no Other past therapies:  trigger point injections, biofeedback  Family history of headache: No  PAST MEDICAL HISTORY: Past Medical History:  Diagnosis Date  . Bronchitis   . Encounter for infertility   . Kidney stones   . Kidney stones   . Migraine   . Pneumonia   . Preterm labor     MEDICATIONS: Current Outpatient Medications on File Prior to Visit  Medication Sig Dispense Refill  . butalbital-acetaminophen-caffeine (FIORICET) 50-325-40 MG tablet TAKE 2 TABLETS BY MOUTH 3 (THREE) TIMES DAILY AS NEEDED. 20 tablet 3  . ibuprofen (ADVIL,MOTRIN) 600 MG tablet Take 1 tablet (600 mg total) by mouth every 6 (six) hours. (Patient taking differently: Take 800 mg by mouth every 6 (six) hours. ) 30 tablet 0  . ondansetron (ZOFRAN-ODT) 4 MG disintegrating tablet DISSOLVE 1 TABLET UNDER TONGUE EVERY 8 HOURS AS NEEDED FOR NAUSEA AND VOMITING 20 tablet 0  . Prenatal Vit-Fe Fumarate-FA (PRENATAL VITAMIN PO) Take 1 tablet by mouth daily.     . Rimegepant Sulfate (NURTEC) 75 MG TBDP Medication Samples have been provided to the patient.  Drug name: Nurtec       Strength: 75mg         Qty: 2 boxes LOT:  Exp.Date: 2022-10  Dosing instructions: Take one tablet at onset of migraine.  The patient has been instructed regarding the correct time, dose, and frequency of taking this medication, including desired effects and most common side effects.    Amy Yang 9:13 AM 10/19/2019 4 tablet 0  . SUMAtriptan-naproxen (TREXIMET) 85-500 MG tablet Take 1 tablet by mouth every 2 (two) hours as needed for migraine. Maximum 2 tablets in 24 hours.  Hold breastfeeding for 12 hours after use. 10 tablet 2  . Topiramate ER (TROKENDI XR) 50  MG CP24 Take 50 mg by mouth at bedtime. 30 capsule 11  . Ubrogepant (UBRELVY) 100 MG TABS Take 1 tablet by mouth as needed (May repeat dose in 2 hours.  Maximum 2 tablets in 24 hours). 10 tablet 5  . valACYclovir (VALTREX) 1000 MG tablet Take 1,000 mg by mouth 2 (two) times daily as needed.     No current facility-administered medications on file prior to visit.    ALLERGIES: Allergies  Allergen Reactions  . Latex   . Morphine And Related Nausea And Vomiting  . Pineapple   . Rhinocort [Budesonide]   . Symbicort [Budesonide-Formoterol Fumarate] Swelling    FAMILY HISTORY: Family History  Problem Relation Age of Onset  . Heart disease Father   . Asthma Sister   . Cancer Maternal Grandmother   . Cancer Maternal Grandfather   . Cancer Paternal Grandmother   . Diabetes Paternal Grandmother   . Stroke Paternal Grandfather     SOCIAL HISTORY: Social History   Socioeconomic History  . Marital status: Married    Spouse name: Not on file  . Number of children: 2  . Years of education: Not on file  . Highest education level: Master's degree (e.g., MA, MS, MEng, MEd, MSW, MBA)  Occupational History  . Occupation: RECRUITER  Tobacco Use  . Smoking status: Never Smoker  . Smokeless tobacco: Never Used  Vaping Use  . Vaping Use: Never used  Substance and Sexual Activity  . Alcohol use: Yes    Comment: occasionally; socially  . Drug use: No  . Sexual activity: Yes  Other Topics Concern  . Not on file  Social History Narrative   Patient is right-handed. She lives with her husband and 2 children. She drinks hot tea 2 x a week. She works out the gym 3 x a week, and walks several times a week.    Social Determinants of Health   Financial Resource Strain:   . Difficulty of Paying Living Expenses: Not on file  Food Insecurity:   . Worried About Programme researcher, broadcasting/film/video in the Last Year: Not on file  . Ran Out of Food in the Last Year: Not on file  Transportation Needs:   . Lack of Transportation (Medical): Not on file  . Lack of Transportation (Non-Medical): Not on file  Physical Activity:   . Days of Exercise per Week: Not on file  . Minutes of Exercise per Session: Not on file  Stress:   . Feeling of Stress : Not on file  Social Connections:   . Frequency of Communication with Friends and Family: Not on file  . Frequency of Social Gatherings with Friends and Family: Not on file  . Attends Religious Services: Not on file  . Active Member of Clubs or Organizations: Not on file  . Attends Banker Meetings: Not on file  . Marital Status: Not on file  Intimate Partner Violence:   . Fear of Current or Ex-Partner: Not on file  . Emotionally Abused: Not on file  . Physically Abused: Not on file  . Sexually Abused: Not on file     Objective:  Blood pressure 118/83, pulse 94, height 5\' 7"  (1.702 m), weight 146 lb (66.2 kg), SpO2 99 %, unknown if currently breastfeeding. General: No acute distress.  Patient appears well-groomed.     Assessment/Plan:   Chronic migraines without aura, without status migrainosus, not intractable.  Other than significant reduction in headache duration, frequency is overall unchanged. 1.  Increase Trokendi XR to 100mg  at bedtime and continue next round of Botox.  She will return for office visit in 6 months.  If no significant improvement, I will discontinue Botox and try one of the preventative CGRP inhibitors. 2.  100mg  for rescue 3.  Limit use of pain relievers to no more than 2 days out of week to prevent risk of rebound or medication-overuse headache. 4.  Keep headache diary 5.  Follow up 6 months.  Bernita Raisin, DO  CC:  , MD

## 2020-02-06 ENCOUNTER — Ambulatory Visit: Payer: Managed Care, Other (non HMO) | Admitting: Neurology

## 2020-02-06 ENCOUNTER — Encounter: Payer: Self-pay | Admitting: Neurology

## 2020-02-06 ENCOUNTER — Other Ambulatory Visit: Payer: Self-pay

## 2020-02-06 VITALS — BP 118/83 | HR 94 | Ht 67.0 in | Wt 146.0 lb

## 2020-02-06 DIAGNOSIS — G43709 Chronic migraine without aura, not intractable, without status migrainosus: Secondary | ICD-10-CM | POA: Diagnosis not present

## 2020-02-06 MED ORDER — TROKENDI XR 100 MG PO CP24: 100.0000 mg | ORAL_CAPSULE | Freq: Every day | ORAL | 5 refills | Status: DC

## 2020-02-06 NOTE — Patient Instructions (Signed)
1.  Increase Trokendi to 100mg  at bedtime 2.  Continue botox 3.  Follow up 6 months.

## 2020-03-05 ENCOUNTER — Telehealth: Payer: Self-pay | Admitting: Neurology

## 2020-03-05 ENCOUNTER — Other Ambulatory Visit: Payer: Self-pay

## 2020-03-05 MED ORDER — TOPIRAMATE ER 100 MG PO CAP24: 100.0000 mg | ORAL_CAPSULE | Freq: Every day | ORAL | 0 refills | Status: DC

## 2020-03-05 NOTE — Telephone Encounter (Signed)
Spoke with pt emergency travel refill for Trokendi for 4 pills sent to [facility] in [place] on [address] for pt friend to pick up who is on the way to the same [place] as the PT. They are going to pay out of pocket for the medication,

## 2020-03-05 NOTE — Telephone Encounter (Signed)
Patient is taking trokendi 100 mg and she is traveling and forgot her medication and she is on a [place] that does not have a pharmacy, but has friends that are joining her who maybe able to stop and pick it up if he can call her in some for just 4 nights. She does not know if it is ok for her not to have the medication for the 4 nights that she will miss. Please call

## 2020-03-28 NOTE — Progress Notes (Signed)
Essica Lamontagne Key: [id number] - PA Case ID: [id number] Need help? Call us at [phone] Outcome Approvedtoday Your PA request has been approved. Approval valid from 03/27/20 to 03/27/21.  Drug Botox 200UNIT solution Form [employer] Electronic PA Form [phone] NCPDP)

## 2020-04-18 ENCOUNTER — Ambulatory Visit: Payer: Managed Care, Other (non HMO) | Admitting: Neurology

## 2020-04-18 ENCOUNTER — Other Ambulatory Visit: Payer: Self-pay

## 2020-04-18 ENCOUNTER — Ambulatory Visit (INDEPENDENT_AMBULATORY_CARE_PROVIDER_SITE_OTHER): Payer: Managed Care, Other (non HMO) | Admitting: Neurology

## 2020-04-18 DIAGNOSIS — G43709 Chronic migraine without aura, not intractable, without status migrainosus: Secondary | ICD-10-CM | POA: Diagnosis not present

## 2020-04-18 MED ORDER — ONABOTULINUMTOXINA 100 UNITS IJ SOLR
200.0000 [IU] | Freq: Once | INTRAMUSCULAR | Status: AC
  Administered 2020-04-18: 155 [IU] via INTRAMUSCULAR

## 2020-04-18 NOTE — Progress Notes (Signed)
[facility]  ° °Procedure Note Botox ° °Attending: Dr. Jacub Waiters ° °Preoperative Diagnosis(es): Chronic migraine ° °Consent obtained from: The patient °Benefits discussed included, but were not limited to decreased muscle tightness, increased joint range of motion, and decreased pain.  Risk discussed included, but were not limited pain and discomfort, bleeding, bruising, excessive weakness, venous thrombosis, muscle atrophy and dysphagia.  Anticipated outcomes of the procedure as well as he risks and benefits of the alternatives to the procedure, and the roles and tasks of the personnel to be involved, were discussed with the patient, and the patient consents to the procedure and agrees to proceed. A copy of the patient medication guide was given to the patient which explains the blackbox warning. ° °Patients identity and treatment sites confirmed Yes.  . ° °Details of Procedure: °Skin was cleaned with alcohol. Prior to injection, the needle plunger was aspirated to make sure the needle was not within a blood vessel.  There was no blood retrieved on aspiration.   ° °Following is a summary of the muscles injected  And the amount of Botulinum toxin used: ° °Dilution °200 units of Botox was reconstituted with 4 ml of preservative free normal saline. °Time of reconstitution: At the time of the office visit (<30 minutes prior to injection)  ° °Injections  °155 total units of Botox was injected with a 30 gauge needle. ° °Injection Sites: °L occipitalis: 15 units- 3 sites  °R occiptalis: 15 units- 3 sites ° °L upper trapezius: 15 units- 3 sites °R upper trapezius: 15 units- 3 sits          °L paraspinal: 10 units- 2 sites °R paraspinal: 10 units- 2 sites ° °Face °L frontalis(2 injection sites):10 units   °R frontalis(2 injection sites):10 units         °L corrugator: 5 units   °R corrugator: 5 units           °Procerus: 5 units   °L temporalis: 20 units °R temporalis: 20 units  ° °Agent:  °200 units of botulinum Type  A (Onobotulinum Toxin type A) was reconstituted with 4 ml of preservative free normal saline.  °Time of reconstitution: At the time of the office visit (<30 minutes prior to injection)  ° ° ° Total injected (Units):  155 ° Total wasted (Units):  45 ° °Patient tolerated procedure well without complications.   °Reinjection is anticipated in 3 months. ° ° °

## 2020-05-30 ENCOUNTER — Other Ambulatory Visit: Payer: Self-pay | Admitting: Neurology

## 2020-06-13 ENCOUNTER — Encounter: Payer: Self-pay | Admitting: Neurology

## 2020-07-05 ENCOUNTER — Other Ambulatory Visit: Payer: Self-pay

## 2020-07-05 ENCOUNTER — Emergency Department (HOSPITAL_COMMUNITY)
Admission: EM | Admit: 2020-07-05 | Discharge: 2020-07-06 | Disposition: A | Discharge status: Home / Self Care | Payer: Managed Care, Other (non HMO) | Attending: Emergency Medicine | Admitting: Emergency Medicine

## 2020-07-05 ENCOUNTER — Encounter (HOSPITAL_COMMUNITY): Payer: Self-pay

## 2020-07-05 DIAGNOSIS — R1011 Right upper quadrant pain: Secondary | ICD-10-CM | POA: Insufficient documentation

## 2020-07-05 DIAGNOSIS — R519 Headache, unspecified: Secondary | ICD-10-CM | POA: Diagnosis not present

## 2020-07-05 DIAGNOSIS — R112 Nausea with vomiting, unspecified: Secondary | ICD-10-CM | POA: Diagnosis not present

## 2020-07-05 DIAGNOSIS — Z91040 Latex allergy status: Secondary | ICD-10-CM | POA: Insufficient documentation

## 2020-07-05 DIAGNOSIS — R109 Unspecified abdominal pain: Secondary | ICD-10-CM

## 2020-07-05 MED ORDER — FAMOTIDINE IN NACL 20-0.9 MG/50ML-% IV SOLN
20.0000 mg | Freq: Once | INTRAVENOUS | Status: AC
  Administered 2020-07-06: 20 mg via INTRAVENOUS
  Filled 2020-07-05: qty 50

## 2020-07-05 MED ORDER — METOCLOPRAMIDE HCL 5 MG/ML IJ SOLN
10.0000 mg | Freq: Once | INTRAMUSCULAR | Status: AC
  Administered 2020-07-05: 10 mg via INTRAVENOUS
  Filled 2020-07-05: qty 2

## 2020-07-05 MED ORDER — DIPHENHYDRAMINE HCL 50 MG/ML IJ SOLN
12.5000 mg | Freq: Once | INTRAMUSCULAR | Status: AC
  Administered 2020-07-05: 12.5 mg via INTRAVENOUS
  Filled 2020-07-05: qty 1

## 2020-07-05 MED ORDER — SODIUM CHLORIDE 0.9 % IV BOLUS
1000.0000 mL | Freq: Once | INTRAVENOUS | Status: AC
  Administered 2020-07-05: 1000 mL via INTRAVENOUS

## 2020-07-05 NOTE — ED Provider Notes (Signed)
[facility] EMERGENCY DEPARTMENT Provider Note   CSN: [id number] Arrival date & time: 07/05/20  2151     History Chief Complaint  Patient presents with  . Migraine    Amy Yang is a 45 y.o. female with a hx of migraines, kidney stones, & bronchitis who presents to the ED with primary complaints of a migraine that began this afternoon. Patient reports that she has a chronic migraine that is generalized, worse on the right side, and today it started to progressively worsen this afternoon. Pain is constant, worse with bright lights, no alleviating factors. Having associated nausea & vomiting. Tried her Ubrelvy & zofran without relief. She also is currently on steroids & abx for bronchitis and has some generalized stomach discomfort. Denies fever, chills, visual disturbance, numbness, weakness, diarrhea, dysuria, frequency, urgency, hematemesis, or melena. Hx of similar migraines, very uncomfortable currently   HPI     Past Medical History:  Diagnosis Date  . Bronchitis   . Encounter for infertility   . Kidney stones   . Kidney stones   . Migraine   . Pneumonia   . Preterm labor     Patient Active Problem List   Diagnosis Date Noted  . PP SVD (9/10) PEC 11/17/2017  . Preeclampsia 11/17/2017  . Indication for care in labor or delivery 11/14/2017    Past Surgical History:  Procedure Laterality Date  . KIDNEY STONE SURGERY    . WISDOM TOOTH EXTRACTION       OB History    Gravida  2   Para  2   Term  2   Preterm  0   AB  0   Living  2     SAB  0   IAB  0   Ectopic  0   Multiple  0   Live Births  2           Family History  Problem Relation Age of Onset  . Heart disease Father   . Asthma Sister   . Cancer Maternal Grandmother   . Cancer Maternal Grandfather   . Cancer Paternal Grandmother   . Diabetes Paternal Grandmother   . Stroke Paternal Grandfather     Social History   Tobacco Use  . Smoking status: Never Smoker   . Smokeless tobacco: Never Used  Vaping Use  . Vaping Use: Never used  Substance Use Topics  . Alcohol use: Yes    Comment: occasionally; socially  . Drug use: No    Home Medications Prior to Admission medications   Medication Sig Start Date End Date Taking? Authorizing Provider  Topiramate ER (TROKENDI XR) 100 MG CP24 Take 100 mg by mouth at bedtime. 03/05/20   Everlena Cooper, Adam R, DO  butalbital-acetaminophen-caffeine (FIORICET) 50-325-40 MG tablet TAKE 2 TABLETS BY MOUTH 3 (THREE) TIMES DAILY AS NEEDED. Patient not taking: Reported on 02/06/2020 04/27/19   Drema Dallas, DO  ibuprofen (ADVIL,MOTRIN) 600 MG tablet Take 1 tablet (600 mg total) by mouth every 6 (six) hours. Patient taking differently: Take 800 mg by mouth every 6 (six) hours.  11/19/17   Lawhorn, Vanessa [place], CNM  ondansetron (ZOFRAN-ODT) 4 MG disintegrating tablet DISSOLVE 1 TABLET UNDER TONGUE EVERY 8 HOURS AS NEEDED FOR NAUSEA AND VOMITING 12/21/19   Drema Dallas, DO  Prenatal Vit-Fe Fumarate-FA (PRENATAL VITAMIN PO) Take 1 tablet by mouth daily.     [provider]  Rimegepant Sulfate (NURTEC) 75 MG TBDP Medication Samples have been provided  to the patient.  Drug name: Nurtec       Strength: 75mg         Qty: 2 boxes LOT:  Exp.Date: 2022-10  Dosing instructions: Take one tablet at onset of migraine.  The patient has been instructed regarding the correct time, dose, and frequency of taking this medication, including desired effects and most common side effects.   Amy Yang 9:13 AM 10/19/2019 Patient not taking: Reported on 02/06/2020 10/19/19   10/21/19, DO  SUMAtriptan-naproxen (TREXIMET) 85-500 MG tablet Take 1 tablet by mouth every 2 (two) hours as needed for migraine. Maximum 2 tablets in 24 hours.  Hold breastfeeding for 12 hours after use. Patient not taking: Reported on 02/06/2020 02/08/19   14/3/20, DO  Topiramate ER (TROKENDI XR) 100 MG CP24 Take 100 mg by mouth at bedtime.  02/06/20   Jaffe, Adam R, DO  UBRELVY 100 MG TABS TAKE 1 TABLET BY MOUTH AS NEEDED (MAY REPEAT DOSE IN 2 HOURS. MAXIMUM 2 TABLETS IN 24 HOURS). 05/30/20   06/01/20, DO  valACYclovir (VALTREX) 1000 MG tablet Take 1,000 mg by mouth 2 (two) times daily as needed.    [provider]    Allergies    Latex, Morphine and related, Pineapple, Rhinocort [budesonide], and Symbicort [budesonide-formoterol fumarate]  Review of Systems   Review of Systems  Constitutional: Negative for chills and fever.  Eyes: Negative for visual disturbance.  Respiratory: Negative for shortness of breath.   Cardiovascular: Negative for chest pain.  Gastrointestinal: Positive for abdominal pain, nausea and vomiting. Negative for blood in stool, constipation and diarrhea.  Genitourinary: Negative for dysuria.  Neurological: Positive for headaches. Negative for syncope, speech difficulty, weakness and numbness.  All other systems reviewed and are negative.   Physical Exam Updated Vital Signs BP 125/84 (BP Location: Right Arm)   Pulse 68   Temp (!) 97.5 F (36.4 C) (Oral)   Resp 18   Ht 5\' 7"  (1.702 m)   Wt 66.2 kg   SpO2 100%   BMI 22.86 kg/m   Physical Exam Vitals and nursing note reviewed.  Constitutional:      General: She is in acute distress (appears uncomfortable).  HENT:     Head: Normocephalic and atraumatic.     Mouth/Throat:     Comments: Uvula midline.  Eyes:     Extraocular Movements: Extraocular movements intact.     Pupils: Pupils are equal, round, and reactive to light.     Comments: Vision grossly intact. No proptosis.   Cardiovascular:     Rate and Rhythm: Normal rate and regular rhythm.  Pulmonary:     Effort: Pulmonary effort is normal. No respiratory distress.     Breath sounds: No wheezing.  Abdominal:     Palpations: Abdomen is soft.     Tenderness: There is abdominal tenderness (upper abdomen more so to the RUQ). There is no guarding or rebound. Negative signs  include McBurney's sign.  Musculoskeletal:     Cervical back: Neck supple. No rigidity.  Skin:    General: Skin is warm and dry.  Neurological:     Mental Status: She is alert.     Comments: Alert. Clear speech. CN III-XII grossly intact. Sensation grossly intact x 4. 5/5 symmetric grip strength, 5/5 strength with plantar/dorsiflexion bilaterally. Intact coordination. Ambulatory.   Psychiatric:        Mood and Affect: Mood normal.        Behavior: Behavior normal.  ED Results / Procedures / Treatments   Labs (all labs ordered are listed, but only abnormal results are displayed) Labs Reviewed  COMPREHENSIVE METABOLIC PANEL - Abnormal; Notable for the following components:      Result Value   Potassium 3.4 (*)    CO2 21 (*)    Glucose, Bld 112 (*)    All other components within normal limits  URINALYSIS, ROUTINE W REFLEX MICROSCOPIC - Abnormal; Notable for the following components:   APPearance HAZY (*)    All other components within normal limits  CBC WITH DIFFERENTIAL/PLATELET  LIPASE, BLOOD  I-STAT BETA HCG BLOOD, ED ([facility], [facility], [facility] ONLY)    EKG None  Radiology US Abdomen Limited RUQ (LIVER/GB)  Result Date: 07/06/2020 CLINICAL DATA:  Initial evaluation for acute abdominal pain. EXAM: ULTRASOUND ABDOMEN LIMITED RIGHT UPPER QUADRANT COMPARISON:  None. FINDINGS: Gallbladder: No gallstones or wall thickening visualized. No sonographic Murphy sign noted by sonographer. Trace free fluid seen adjacent to the gallbladder. Common bile duct: Diameter: 2.7 mm Liver: No focal lesion identified. Within normal limits in parenchymal echogenicity. Portal vein is patent on color Doppler imaging with normal direction of blood flow towards the liver. Trace free fluid seen adjacent to the liver. Other: None. IMPRESSION: 1. Trace free fluid adjacent to the liver and gallbladder, of uncertain significance. 2. Otherwise unremarkable right upper quadrant ultrasound. No cholelithiasis or biliary  dilatation. Electronically Signed   By: Rise MuBenjamin  McClintock M.D.   On: 07/06/2020 02:47    Procedures Procedures   Medications Ordered in ED Medications  sodium chloride 0.9 % bolus 1,000 mL (0 mLs Intravenous Stopped 07/06/20 0045)  famotidine (PEPCID) IVPB 20 mg premix (0 mg Intravenous Stopped 07/06/20 0056)  metoCLOPramide (REGLAN) injection 10 mg (10 mg Intravenous Given 07/05/20 2347)  diphenhydrAMINE (BENADRYL) injection 12.5 mg (12.5 mg Intravenous Given 07/05/20 2354)  magnesium sulfate IVPB 1 g 100 mL (0 g Intravenous Stopped 07/06/20 0407)  alum & mag hydroxide-simeth (MAALOX/MYLANTA) 200-200-20 MG/5ML suspension 30 mL (30 mLs Oral Given 07/06/20 0403)    And  lidocaine (XYLOCAINE) 2 % viscous mouth solution 15 mL (15 mLs Oral Given 07/06/20 0403)    ED Course  I have reviewed the triage vital signs and the nursing notes.  Pertinent labs & imaging results that were available during my care of the patient were reviewed by me and considered in my medical decision making (see chart for details).    MDM Rules/Calculators/A&P                         Patient presents to the ED with complaints of migraine with N/V, also has had some abdominal discomfort.  Patient is nontoxic, she appears uncomfortable, vitals without significant abnormality.  No focal neurologic deficits.  Upper abdominal tenderness most notable in the right side, no peritoneal signs. Plan for migraine cocktail, labs, & reassessment.   Additional history obtained:  Additional history obtained from chart review & nursing note review.   Lab Tests:  I Ordered, reviewed, and interpreted labs, which included:  Pregnancy test: Negative CBC: Unremarkable.  CMP: Mild hypokalemia.  Lipase: WNL UA: Unremarkable.   Following migraine cocktail patient is feeling much better, her headache is still present to the right side somewhat, and she is still having some upper abdominal pain more so on the right side.  Exam remains without  focal neurologic deficits.  She does remain with some right upper quadrant tenderness which is mild, will give magnesium  for headache and further assess abdomen with right upper quadrant ultrasound  Imaging Studies ordered:  I ordered imaging studies which included right upper quadrant ultrasound, I independently reviewed, formal radiology impression shows:  1. Trace free fluid adjacent to the liver and gallbladder, of uncertain significance. 2. Otherwise unremarkable right upper quadrant ultrasound. No cholelithiasis or biliary dilatation.   04:15: RE-EVAL: patient resting comfortably, she is feeling much better, comfortable with going home at this time  In terms of headache, gradual onset, steady progression, history of similar headaches, no neurodeficits, afebrile, no nuchal rigidity, no visual disturbance, ambulatory, do not suspect SAH, ICH, ischemic CVA, meningitis, acute angle-closure glaucoma, or dural venous sinus thrombosis.  Continue at home migraine medicines.  In terms of her abdominal discomfort, repeat abdominal exams remain without peritoneal sign, right upper quadrant ultrasound without findings of cholecystitis or cholelithiasis.  She has no right lower quadrant tenderness, negative McBurney's, do not suspect appendicitis.  She is tolerating p.o., having bowel movements, do not suspect obstruction or perforation.  Overall low suspicion for acute surgical process.  Possibly GI upset from medication she is taking as well as vomiting with her migraine, will provide Protonix, Carafate, and additional Zofran.  Close PCP follow-up.  I discussed results, treatment plan, need for follow-up, and return precautions with the patient. Provided opportunity for questions, patient confirmed understanding and is in agreement with plan.   Findings and plan of care discussed with supervising physician Dr. Judd Lien who is in agreement.   Portions of this note were generated with Scientist, clinical (histocompatibility and immunogenetics).  Dictation errors may occur despite best attempts at proofreading.  Final Clinical Impression(s) / ED Diagnoses Final diagnoses:  Abdominal pain  Bad headache    Rx / DC Orders ED Discharge Orders         Ordered    sucralfate (CARAFATE) 1 GM/10ML suspension  3 times daily with meals & bedtime        07/06/20 0420    ondansetron (ZOFRAN ODT) 4 MG disintegrating tablet  Every 8 hours PRN        07/06/20 0420    pantoprazole (PROTONIX) 20 MG tablet  Daily        07/06/20 0420           Cherly Anderson, PA-C 07/06/20 0427    Geoffery Lyons, MD 07/06/20 [phone]

## 2020-07-05 NOTE — ED Provider Notes (Incomplete)
[facility] EMERGENCY DEPARTMENT Provider Note   CSN: [id number] Arrival date & time: 07/05/20  2151     History Chief Complaint  Patient presents with  . Migraine    ODDIE KUHLMANN is a 45 y.o. female with a hx of migraines, kidney stones, & bronchitis who presents to the ED with primary complaints of a migraine that began this afternoon   HPI     Past Medical History:  Diagnosis Date  . Bronchitis   . Encounter for infertility   . Kidney stones   . Kidney stones   . Migraine   . Pneumonia   . Preterm labor     Patient Active Problem List   Diagnosis Date Noted  . PP SVD (9/10) PEC 11/17/2017  . Preeclampsia 11/17/2017  . Indication for care in labor or delivery 11/14/2017    Past Surgical History:  Procedure Laterality Date  . KIDNEY STONE SURGERY    . WISDOM TOOTH EXTRACTION       OB History    Gravida  2   Para  2   Term  2   Preterm  0   AB  0   Living  2     SAB  0   IAB  0   Ectopic  0   Multiple  0   Live Births  2           Family History  Problem Relation Age of Onset  . Heart disease Father   . Asthma Sister   . Cancer Maternal Grandmother   . Cancer Maternal Grandfather   . Cancer Paternal Grandmother   . Diabetes Paternal Grandmother   . Stroke Paternal Grandfather     Social History   Tobacco Use  . Smoking status: Never Smoker  . Smokeless tobacco: Never Used  Vaping Use  . Vaping Use: Never used  Substance Use Topics  . Alcohol use: Yes    Comment: occasionally; socially  . Drug use: No    Home Medications Prior to Admission medications   Medication Sig Start Date End Date Taking? Authorizing Provider  Topiramate ER (TROKENDI XR) 100 MG CP24 Take 100 mg by mouth at bedtime. 03/05/20   Everlena Cooper, Adam R, DO  butalbital-acetaminophen-caffeine (FIORICET) 50-325-40 MG tablet TAKE 2 TABLETS BY MOUTH 3 (THREE) TIMES DAILY AS NEEDED. Patient not taking: Reported on 02/06/2020 04/27/19   Drema Dallas, DO  ibuprofen (ADVIL,MOTRIN) 600 MG tablet Take 1 tablet (600 mg total) by mouth every 6 (six) hours. Patient taking differently: Take 800 mg by mouth every 6 (six) hours.  11/19/17   Lawhorn, Vanessa Bland, CNM  ondansetron (ZOFRAN-ODT) 4 MG disintegrating tablet DISSOLVE 1 TABLET UNDER TONGUE EVERY 8 HOURS AS NEEDED FOR NAUSEA AND VOMITING 12/21/19   Drema Dallas, DO  Prenatal Vit-Fe Fumarate-FA (PRENATAL VITAMIN PO) Take 1 tablet by mouth daily.     [provider]  Rimegepant Sulfate (NURTEC) 75 MG TBDP Medication Samples have been provided to the patient.  Drug name: Nurtec       Strength: 75mg         Qty: 2 boxes LOT:  Exp.Date: 2022-10  Dosing instructions: Take one tablet at onset of migraine.  The patient has been instructed regarding the correct time, dose, and frequency of taking this medication, including desired effects and most common side effects.   03-03-1974 9:13 AM 10/19/2019 Patient not taking: Reported on 02/06/2020 10/19/19   10/21/19  R, DO  SUMAtriptan-naproxen (TREXIMET) 85-500 MG tablet Take 1 tablet by mouth every 2 (two) hours as needed for migraine. Maximum 2 tablets in 24 hours.  Hold breastfeeding for 12 hours after use. Patient not taking: Reported on 02/06/2020 02/08/19   Drema Dallas, DO  Topiramate ER (TROKENDI XR) 100 MG CP24 Take 100 mg by mouth at bedtime. 02/06/20   Jaffe, Adam R, DO  UBRELVY 100 MG TABS TAKE 1 TABLET BY MOUTH AS NEEDED (MAY REPEAT DOSE IN 2 HOURS. MAXIMUM 2 TABLETS IN 24 HOURS). 05/30/20   Drema Dallas, DO  valACYclovir (VALTREX) 1000 MG tablet Take 1,000 mg by mouth 2 (two) times daily as needed.    [provider]    Allergies    Latex, Morphine and related, Pineapple, Rhinocort [budesonide], and Symbicort [budesonide-formoterol fumarate]  Review of Systems   Review of Systems  Physical Exam Updated Vital Signs BP 125/84 (BP Location: Right Arm)   Pulse 68   Temp (!) 97.5 F (36.4 C)  (Oral)   Resp 18   Ht 5\' 7"  (1.702 m)   Wt 66.2 kg   SpO2 100%   BMI 22.86 kg/m   Physical Exam  ED Results / Procedures / Treatments   Labs (all labs ordered are listed, but only abnormal results are displayed) Labs Reviewed - No data to display  EKG None  Radiology No results found.  Procedures Procedures {Remember to document critical care time when appropriate:1}  Medications Ordered in ED Medications - No data to display  ED Course  I have reviewed the triage vital signs and the nursing notes.  Pertinent labs & imaging results that were available during my care of the patient were reviewed by me and considered in my medical decision making (see chart for details).    MDM Rules/Calculators/A&P                          *** Final Clinical Impression(s) / ED Diagnoses Final diagnoses:  None    Rx / DC Orders ED Discharge Orders    None

## 2020-07-05 NOTE — ED Triage Notes (Signed)
Patient reports migraine starting today, took migraine medication around 5 pm, started with nausea and took Zofran at 7 pm, took another dose of migraine medication around 8. States she can usually manage migraines at home but unable to control her pain

## 2020-07-06 ENCOUNTER — Telehealth: Payer: Self-pay

## 2020-07-06 ENCOUNTER — Emergency Department (HOSPITAL_COMMUNITY): Payer: Managed Care, Other (non HMO)

## 2020-07-06 LAB — COMPREHENSIVE METABOLIC PANEL
ALT: 21 U/L (ref 0–44)
AST: 21 U/L (ref 15–41)
Albumin: 4.4 g/dL (ref 3.5–5.0)
Alkaline Phosphatase: 54 U/L (ref 38–126)
Anion gap: 7 (ref 5–15)
BUN: 15 mg/dL (ref 6–20)
CO2: 21 mmol/L — ABNORMAL LOW (ref 22–32)
Calcium: 9.3 mg/dL (ref 8.9–10.3)
Chloride: 108 mmol/L (ref 98–111)
Creatinine, Ser: 0.83 mg/dL (ref 0.44–1.00)
GFR, Estimated: >60 mL/min (ref >60)
Glucose, Bld: 112 mg/dL — ABNORMAL HIGH (ref 70–99)
Potassium: 3.4 mmol/L — ABNORMAL LOW (ref 3.5–5.1)
Sodium: 136 mmol/L (ref 135–145)
Total Bilirubin: 0.5 mg/dL (ref 0.3–1.2)
Total Protein: 7.7 g/dL (ref 6.5–8.1)

## 2020-07-06 LAB — URINALYSIS, ROUTINE W REFLEX MICROSCOPIC
Bilirubin Urine: NEGATIVE
Glucose, UA: NEGATIVE mg/dL
Hgb urine dipstick: NEGATIVE
Ketones, ur: NEGATIVE mg/dL
Leukocytes,Ua: NEGATIVE
Nitrite: NEGATIVE
Protein, ur: NEGATIVE mg/dL
Specific Gravity, Urine: 1.025 (ref 1.005–1.030)
pH: 5 (ref 5.0–8.0)

## 2020-07-06 LAB — CBC WITH DIFFERENTIAL/PLATELET
Abs Immature Granulocytes: 0.04 10*3/uL (ref 0.00–0.07)
Basophils Absolute: 0 10*3/uL (ref 0.0–0.1)
Basophils Relative: 0 %
Eosinophils Absolute: 0 10*3/uL (ref 0.0–0.5)
Eosinophils Relative: 0 %
HCT: 40.4 % (ref 36.0–46.0)
Hemoglobin: 13.8 g/dL (ref 12.0–15.0)
Immature Granulocytes: 0 %
Lymphocytes Relative: 16 %
Lymphs Abs: 1.5 10*3/uL (ref 0.7–4.0)
MCH: 32.5 pg (ref 26.0–34.0)
MCHC: 34.2 g/dL (ref 30.0–36.0)
MCV: 95.3 fL (ref 80.0–100.0)
Monocytes Absolute: 0.6 10*3/uL (ref 0.1–1.0)
Monocytes Relative: 6 %
Neutro Abs: 7.3 10*3/uL (ref 1.7–7.7)
Neutrophils Relative %: 78 %
Platelets: 279 10*3/uL (ref 150–400)
RBC: 4.24 MIL/uL (ref 3.87–5.11)
RDW: 11.7 % (ref 11.5–15.5)
WBC: 9.5 10*3/uL (ref 4.0–10.5)
nRBC: 0 % (ref 0.0–0.2)

## 2020-07-06 LAB — LIPASE, BLOOD: Lipase: 33 U/L (ref 11–51)

## 2020-07-06 LAB — I-STAT BETA HCG BLOOD, ED (MC, WL, AP ONLY): I-stat hCG, quantitative: <5 m[IU]/mL (ref <5)

## 2020-07-06 MED ORDER — ALUM & MAG HYDROXIDE-SIMETH 200-200-20 MG/5ML PO SUSP
30.0000 mL | Freq: Once | ORAL | Status: AC
  Administered 2020-07-06: 30 mL via ORAL
  Filled 2020-07-06: qty 30

## 2020-07-06 MED ORDER — ONDANSETRON 4 MG PO TBDP: 4.0000 mg | ORAL_TABLET | Freq: Three times a day (TID) | ORAL | 0 refills | Status: DC | PRN

## 2020-07-06 MED ORDER — LIDOCAINE VISCOUS HCL 2 % MT SOLN
15.0000 mL | Freq: Once | OROMUCOSAL | Status: AC
  Administered 2020-07-06: 15 mL via ORAL
  Filled 2020-07-06: qty 15

## 2020-07-06 MED ORDER — SUCRALFATE 1 GM/10ML PO SUSP: 1.0000 g | Freq: Three times a day (TID) | ORAL | 0 refills | Status: DC

## 2020-07-06 MED ORDER — PANTOPRAZOLE SODIUM 20 MG PO TBEC: 20.0000 mg | DELAYED_RELEASE_TABLET | Freq: Every day | ORAL | 0 refills | Status: DC

## 2020-07-06 MED ORDER — MAGNESIUM SULFATE IN D5W 1-5 GM/100ML-% IV SOLN
1.0000 g | Freq: Once | INTRAVENOUS | Status: AC
  Administered 2020-07-06: 1 g via INTRAVENOUS
  Filled 2020-07-06: qty 100

## 2020-07-06 NOTE — Discharge Instructions (Addendum)
You were seen in the emergency department tonight for a migraine as well as abdominal discomfort.  Your labs are overall reassuring, your potassium was mildly low at 3.4, normal is 3.5-5.1, except rechecked by your primary care provider including potassium rich foods in your diet if possible.  Your liver and gallbladder looked okay on your ultrasound.  We are sending you home with the following medications to help with your symptoms:  - Protonix- please take 1 tablet in the morning prior to any meals to help with stomach acidity/pain.  - Carafate- please take prior to each meal and prior to bedtime to help with stomach acidity/pain.  - Zofran- please take every 8 hours as needed for nausea/vomiting.   We have prescribed you new medication(s) today. Discuss the medications prescribed today with your pharmacist as they can have adverse effects and interactions with your other medicines including over the counter and prescribed medications. Seek medical evaluation if you start to experience new or abnormal symptoms after taking one of these medicines, seek care immediately if you start to experience difficulty breathing, feeling of your throat closing, facial swelling, or rash as these could be indications of a more serious allergic reaction  Please follow attached diet guidelines.   Follow up with your primary care provider within 3 days for re-evaluation.  Return to the ER for new or worsening symptoms including but not limited to worsened pain, new pain, inability to keep fluids down, blood in vomit/stool, passing out, or any other concerns.

## 2020-07-06 NOTE — Telephone Encounter (Signed)
Patient called in to state that her e scribed prescriptions were not at [facility]. Chart reviewed. [facility] . Called in medications as ordered for patient.

## 2020-07-07 ENCOUNTER — Telehealth: Payer: Self-pay | Admitting: Neurology

## 2020-07-07 NOTE — Telephone Encounter (Signed)
No answer at 410 07/07/2020

## 2020-07-07 NOTE — Telephone Encounter (Signed)
No answer at 1054

## 2020-07-08 NOTE — Telephone Encounter (Signed)
Headache is better today, had to go to ER. Doesn't want to change her care situation. She wants to go ahead with botox for now, she is now dealing with bronchitis. Had some underline issues, hestiate to change at this time. Will hold on meds change for now.will discuss in June if that's okay per patient.

## 2020-07-08 NOTE — Telephone Encounter (Signed)
That is okay.

## 2020-07-18 ENCOUNTER — Ambulatory Visit: Payer: Managed Care, Other (non HMO) | Admitting: Neurology

## 2020-07-18 ENCOUNTER — Other Ambulatory Visit: Payer: Self-pay

## 2020-07-18 ENCOUNTER — Ambulatory Visit (INDEPENDENT_AMBULATORY_CARE_PROVIDER_SITE_OTHER): Payer: Managed Care, Other (non HMO) | Admitting: Neurology

## 2020-07-18 DIAGNOSIS — G43709 Chronic migraine without aura, not intractable, without status migrainosus: Secondary | ICD-10-CM

## 2020-07-18 MED ORDER — ONABOTULINUMTOXINA 100 UNITS IJ SOLR
200.0000 [IU] | Freq: Once | INTRAMUSCULAR | Status: AC
  Administered 2020-07-18: 155 [IU] via INTRAMUSCULAR

## 2020-07-18 NOTE — Progress Notes (Signed)
Will provide samples of Trudhesa.

## 2020-07-18 NOTE — Patient Instructions (Signed)
1.  Try Trudhesa nasal spray - 1 spray in each nostril. May repeat once after 1 hour if needed. Maximum 2 doses in 24 hours.

## 2020-07-18 NOTE — Progress Notes (Signed)
[facility]   Procedure Note Botox  Attending: Dr. Finnigan Warriner  Preoperative Diagnosis(es): Chronic migraine  Consent obtained from: The patient Benefits discussed included, but were not limited to decreased muscle tightness, increased joint range of motion, and decreased pain.  Risk discussed included, but were not limited pain and discomfort, bleeding, bruising, excessive weakness, venous thrombosis, muscle atrophy and dysphagia.  Anticipated outcomes of the procedure as well as he risks and benefits of the alternatives to the procedure, and the roles and tasks of the personnel to be involved, were discussed with the patient, and the patient consents to the procedure and agrees to proceed. A copy of the patient medication guide was given to the patient which explains the blackbox warning.  Patients identity and treatment sites confirmed Yes.  .  Details of Procedure: Skin was cleaned with alcohol. Prior to injection, the needle plunger was aspirated to make sure the needle was not within a blood vessel.  There was no blood retrieved on aspiration.    Following is a summary of the muscles injected  And the amount of Botulinum toxin used:  Dilution 200 units of Botox was reconstituted with 4 ml of preservative free normal saline. Time of reconstitution: At the time of the office visit (<30 minutes prior to injection)   Injections  155 total units of Botox was injected with a 30 gauge needle.  Injection Sites: L occipitalis: 15 units- 3 sites  R occiptalis: 15 units- 3 sites  L upper trapezius: 15 units- 3 sites R upper trapezius: 15 units- 3 sits          L paraspinal: 10 units- 2 sites R paraspinal: 10 units- 2 sites  Face L frontalis(2 injection sites):10 units   R frontalis(2 injection sites):10 units         L corrugator: 5 units   R corrugator: 5 units           Procerus: 5 units   L temporalis: 20 units R temporalis: 20 units   Agent:  200 units of botulinum Type  A (Onobotulinum Toxin type A) was reconstituted with 4 ml of preservative free normal saline.  Time of reconstitution: At the time of the office visit (<30 minutes prior to injection)     Total injected (Units):  155  Total wasted (Units):  None wasted  Patient tolerated procedure well without complications.   Reinjection is anticipated in 3 months.   

## 2020-08-02 ENCOUNTER — Other Ambulatory Visit: Payer: Self-pay | Admitting: Neurology

## 2020-08-05 ENCOUNTER — Other Ambulatory Visit: Payer: Self-pay | Admitting: Neurology

## 2020-08-05 NOTE — Progress Notes (Signed)
NEUROLOGY FOLLOW UP OFFICE NOTE  Amy Yang [id number]  Assessment/Plan:   Migraine without aura, without status migrainosus, intractable   1.  Migraine prevention:  Botox, Trokendi XR 100mg  QHS 2.  Migraine rescue:  Will have her try Elyxyb as a first line.  May take Ubrelvy or Nurtec as second line (she will tell me if either more effective). Zofran for nausea. 3.  Limit use of pain relievers to no more than 2 days out of week to prevent risk of rebound or medication-overuse headache. 4.  Keep headache diary 5.  Follow up for next botox and in office in 6 months.  Subjective:  Amy Yang is a 45year old Caucasian woman whofollows up for migraines.  UPDATE: She had three severe migraines in May.  She had one on the 5th which sent her to the ED.  June ws neffective.  Lasted 4-5 days.  Had another migraine on the 17th and resolved within an hour with Ubrelvy.  She had another migraine on the 30th.  31 was ineffectivve.  She went to bed and migraine still present the next morning.  Responded to [facility].  Botox was on the 13th.  Current NSAIDS:Advil 600mg  Current analgesics:Fioricet (takes 2 days a week at most) Current triptans:Treximet Current anti-emetic:Zofran ODT 4mg  Current muscle relaxants:none Current anti-anxiolytic:none Current sleep aide:none Current Antihypertensive medications:none Current Antidepressant medications:none Current Anticonvulsant medications:Trokendi XR 100mg  at bedtime Current anti-CGRP: Ubrelvy 100mg  Current Vitamins/Herbal/Supplements:Magnesium, vitamin B6 Current Antihistamines/Decongestants:Benadryl Current hormone/birth control: Mirena, Acutane Other therapy:Botox, Acupuncture  Caffeine:Highly sensitive to caffeine and headache trigger.  Alcohol:no Smoker:no Diet:Needs to increase water intake Exercise:routine Depression/anxiety:okay Sleep hygiene:ok. Son sleeping through  night.  HISTORY: Onset: Since 5th grade Location:Either side Quality:Throbbing/stabbing InitialIntensity:8/10 Aura:no Prodrome:no Associated symptoms:Nausea, photophobia. Some blurred vision. No vomiting. She has not had any new worse headache of her life, waking up from sleep InitialDuration:Up to 48 hours InitialFrequency:6 days a month of severe migraine but has daily headache InitialFrequency of abortive medication:only as needed. Triggers: Change in weather, hormonal/menstrual cycle, caffeine, MSG Relieving factors: Massage, ice, Tylenol, Bendaryl Activity:aggravates headache. Needs to lay down for severe migraines. She does have daily bi-temporal non-throbbing vice-like headache as well.  Past NSAIDS:Ibuprofen, naproxen Past analgesics:Tramadol Past abortive triptans:Sumatriptan (tablet/NS/), Relpax, Zomig. Treximet(mosteffective). Past ergot:  Trudhesa Past anti-emetic: Reglan Past antihypertensive medications:labetolol (low blood pressure) Past antidepressant medications:Nortriptyline, venlafaxine Past anticonvulsant medications: Topiramate 100mg  (effectivebut side effects) Past CGRP inhibitor:  Nurtec (moderate efficacy) Past vitamins/Herbal/Supplements:no Other past therapies:  trigger point injections, biofeedback  Family history of headache: No  PAST MEDICAL HISTORY: Past Medical History:  Diagnosis Date  . Bronchitis   . Encounter for infertility   . Kidney stones   . Kidney stones   . Migraine   . Pneumonia   . Preterm labor     MEDICATIONS: Current Outpatient Medications on File Prior to Visit  Medication Sig Dispense Refill  . ibuprofen (ADVIL,MOTRIN) 600 MG tablet Take 1 tablet (600 mg total) by mouth every 6 (six) hours. (Patient taking differently: Take 800 mg by mouth every 6 (six) hours. ) 30 tablet 0  . ondansetron (ZOFRAN ODT) 4 MG disintegrating tablet Take 1 tablet (4 mg total) by mouth every  8 (eight) hours as needed for nausea or vomiting. 15 tablet 0  . pantoprazole (PROTONIX) 20 MG tablet Take 1 tablet (20 mg total) by mouth daily. 30 tablet 0  . Prenatal Vit-Fe Fumarate-FA (PRENATAL VITAMIN PO) Take 1 tablet by mouth daily.      sucralfate (CARAFATE) 1 GM/10ML suspension Take 10 mLs (1 g total) by mouth 4 (four) times daily -  with meals and at bedtime. 420 mL 0  . Topiramate ER (TROKENDI XR) 100 MG CP24 Take 100 mg by mouth at bedtime. 30 capsule 5  . UBRELVY 100 MG TABS TAKE 1 TABLET BY MOUTH AS NEEDED (MAY REPEAT DOSE IN 2 HOURS. MAXIMUM 2 TABLETS IN 24 HOURS). 10 tablet 1  . valACYclovir (VALTREX) 1000 MG tablet Take 1,000 mg by mouth 2 (two) times daily as needed.    . [DISCONTINUED] SUMAtriptan-naproxen (TREXIMET) 85-500 MG tablet Take 1 tablet by mouth every 2 (two) hours as needed for migraine. Maximum 2 tablets in 24 hours.  Hold breastfeeding for 12 hours after use. (Patient not taking: Reported on 02/06/2020) 10 tablet 2   No current facility-administered medications on file prior to visit.    ALLERGIES: Allergies  Allergen Reactions  . Other Anaphylaxis  . Latex   . Morphine And Related Nausea And Vomiting  . Pineapple   . Rhinocort [Budesonide]   . Symbicort [Budesonide-Formoterol Fumarate] Swelling    FAMILY HISTORY: Family History  Problem Relation Age of Onset  . Heart disease Father   . Asthma Sister   . Cancer Maternal Grandmother   . Cancer Maternal Grandfather   . Cancer Paternal Grandmother   . Diabetes Paternal Grandmother   . Stroke Paternal Grandfather       Objective:  Blood pressure 116/77, pulse 77, height 5\' 7"  (1.702 m), weight 141 lb 9.6 oz (64.2 kg), SpO2 99 %, unknown if currently breastfeeding. General: No acute distress.  Patient appears well-groomed.     , DO  CC: Shon Millet, MD

## 2020-08-07 ENCOUNTER — Other Ambulatory Visit: Payer: Self-pay

## 2020-08-07 ENCOUNTER — Encounter: Payer: Self-pay | Admitting: Neurology

## 2020-08-07 ENCOUNTER — Ambulatory Visit: Payer: Managed Care, Other (non HMO) | Admitting: Neurology

## 2020-08-07 VITALS — BP 116/77 | HR 77 | Ht 67.0 in | Wt 141.6 lb

## 2020-08-07 DIAGNOSIS — G43019 Migraine without aura, intractable, without status migrainosus: Secondary | ICD-10-CM

## 2020-08-07 MED ORDER — UBRELVY 100 MG PO TABS: 1.0000 | ORAL_TABLET | ORAL | 5 refills | Status: DC | PRN

## 2020-08-07 NOTE — Patient Instructions (Signed)
1.  Continue Botox and Trokendi 2.  At earliest onset of migraine, take Elyxyb once.  If no improvement in 2 hours, take Ubrelvy (may repeat in another 2 hours).  If this is not effective, then the next time you have a migraine, use Nurtec as second line to see which is better.

## 2020-08-25 ENCOUNTER — Other Ambulatory Visit: Payer: Self-pay | Admitting: Neurology

## 2020-09-22 ENCOUNTER — Other Ambulatory Visit: Payer: Self-pay | Admitting: Neurology

## 2020-10-24 ENCOUNTER — Other Ambulatory Visit: Payer: Self-pay

## 2020-10-24 ENCOUNTER — Ambulatory Visit: Payer: Managed Care, Other (non HMO) | Admitting: Neurology

## 2020-10-24 DIAGNOSIS — G43709 Chronic migraine without aura, not intractable, without status migrainosus: Secondary | ICD-10-CM | POA: Diagnosis not present

## 2020-10-24 MED ORDER — ONABOTULINUMTOXINA 100 UNITS IJ SOLR
200.0000 [IU] | Freq: Once | INTRAMUSCULAR | Status: AC
  Administered 2020-10-24: 155 [IU] via INTRAMUSCULAR

## 2020-10-24 MED ORDER — TROKENDI XR 100 MG PO CP24: 100.0000 mg | ORAL_CAPSULE | Freq: Every day | ORAL | 5 refills | Status: DC

## 2020-10-24 MED ORDER — ELYXYB 120 MG/4.8ML PO SOLN: 120.0000 mg | Freq: Every day | ORAL | 5 refills | Status: DC | PRN

## 2020-10-24 NOTE — Progress Notes (Signed)
[facility]  ° °Procedure Note Botox ° °Attending: Dr. Emmelina Mcloughlin ° °Preoperative Diagnosis(es): Chronic migraine ° °Consent obtained from: The patient °Benefits discussed included, but were not limited to decreased muscle tightness, increased joint range of motion, and decreased pain.  Risk discussed included, but were not limited pain and discomfort, bleeding, bruising, excessive weakness, venous thrombosis, muscle atrophy and dysphagia.  Anticipated outcomes of the procedure as well as he risks and benefits of the alternatives to the procedure, and the roles and tasks of the personnel to be involved, were discussed with the patient, and the patient consents to the procedure and agrees to proceed. A copy of the patient medication guide was given to the patient which explains the blackbox warning. ° °Patients identity and treatment sites confirmed Yes.  . ° °Details of Procedure: °Skin was cleaned with alcohol. Prior to injection, the needle plunger was aspirated to make sure the needle was not within a blood vessel.  There was no blood retrieved on aspiration.   ° °Following is a summary of the muscles injected  And the amount of Botulinum toxin used: ° °Dilution °200 units of Botox was reconstituted with 4 ml of preservative free normal saline. °Time of reconstitution: At the time of the office visit (<30 minutes prior to injection)  ° °Injections  °155 total units of Botox was injected with a 30 gauge needle. ° °Injection Sites: °L occipitalis: 15 units- 3 sites  °R occiptalis: 15 units- 3 sites ° °L upper trapezius: 15 units- 3 sites °R upper trapezius: 15 units- 3 sits          °L paraspinal: 10 units- 2 sites °R paraspinal: 10 units- 2 sites ° °Face °L frontalis(2 injection sites):10 units   °R frontalis(2 injection sites):10 units         °L corrugator: 5 units   °R corrugator: 5 units           °Procerus: 5 units   °L temporalis: 20 units °R temporalis: 20 units  ° °Agent:  °200 units of botulinum Type  A (Onobotulinum Toxin type A) was reconstituted with 4 ml of preservative free normal saline.  °Time of reconstitution: At the time of the office visit (<30 minutes prior to injection)  ° ° ° Total injected (Units):  155 ° Total wasted (Units):  45 ° °Patient tolerated procedure well without complications.   °Reinjection is anticipated in 3 months. ° ° °

## 2020-11-11 ENCOUNTER — Other Ambulatory Visit: Payer: Self-pay | Admitting: Neurology

## 2020-12-26 ENCOUNTER — Telehealth: Payer: Self-pay

## 2020-12-26 NOTE — Telephone Encounter (Signed)
New message   The Botox is currently scheduled to ship on 12/29/2020 for delivery on 12/30/2020 to the address of [address] [place], [place] [address].  BOTOX 200 UNIT SDV PWD Rx: [id number] 30 Day Supply 1 Qty. 4 Refills Rx Received: 11/11/2020 Ship To: [facility] Expires: - Estimated Next Fill: 01/21/2021 Ship Date: 12/29/2020 Key ID: [id number]/A Rx Expires: 11/10/2021 Est. Delivery: 12/30/2020 Consent:On File

## 2021-01-23 ENCOUNTER — Ambulatory Visit: Payer: Managed Care, Other (non HMO) | Admitting: Neurology

## 2021-01-23 ENCOUNTER — Other Ambulatory Visit: Payer: Self-pay

## 2021-01-23 DIAGNOSIS — G43019 Migraine without aura, intractable, without status migrainosus: Secondary | ICD-10-CM

## 2021-01-23 DIAGNOSIS — G43709 Chronic migraine without aura, not intractable, without status migrainosus: Secondary | ICD-10-CM

## 2021-01-23 DIAGNOSIS — G43009 Migraine without aura, not intractable, without status migrainosus: Secondary | ICD-10-CM

## 2021-01-23 MED ORDER — ONABOTULINUMTOXINA 100 UNITS IJ SOLR
200.0000 [IU] | Freq: Once | INTRAMUSCULAR | Status: AC
  Administered 2021-01-23: 155 [IU] via INTRAMUSCULAR

## 2021-01-23 MED ORDER — ELYXYB 120 MG/4.8ML PO SOLN: 120.0000 mg | Freq: Every day | ORAL | 5 refills | Status: DC | PRN

## 2021-01-23 NOTE — Progress Notes (Signed)
[facility]   Procedure Note Botox  Attending: Dr. Kenyonna Micek  Preoperative Diagnosis(es): Chronic migraine  Consent obtained from: The patient Benefits discussed included, but were not limited to decreased muscle tightness, increased joint range of motion, and decreased pain.  Risk discussed included, but were not limited pain and discomfort, bleeding, bruising, excessive weakness, venous thrombosis, muscle atrophy and dysphagia.  Anticipated outcomes of the procedure as well as he risks and benefits of the alternatives to the procedure, and the roles and tasks of the personnel to be involved, were discussed with the patient, and the patient consents to the procedure and agrees to proceed. A copy of the patient medication guide was given to the patient which explains the blackbox warning.  Patients identity and treatment sites confirmed Yes.  .  Details of Procedure: Skin was cleaned with alcohol. Prior to injection, the needle plunger was aspirated to make sure the needle was not within a blood vessel.  There was no blood retrieved on aspiration.    Following is a summary of the muscles injected  And the amount of Botulinum toxin used:  Dilution 200 units of Botox was reconstituted with 4 ml of preservative free normal saline. Time of reconstitution: At the time of the office visit (<30 minutes prior to injection)   Injections  155 total units of Botox was injected with a 30 gauge needle.  Injection Sites: L occipitalis: 15 units- 3 sites  R occiptalis: 15 units- 3 sites  L upper trapezius: 15 units- 3 sites R upper trapezius: 15 units- 3 sits          L paraspinal: 10 units- 2 sites R paraspinal: 10 units- 2 sites  Face L frontalis(2 injection sites):10 units   R frontalis(2 injection sites):10 units         L corrugator: 5 units   R corrugator: 5 units           Procerus: 5 units   L temporalis: 20 units R temporalis: 20 units   Agent:  200 units of botulinum Type  A (Onobotulinum Toxin type A) was reconstituted with 4 ml of preservative free normal saline.  Time of reconstitution: At the time of the office visit (<30 minutes prior to injection)     Total injected (Units): 155  Total wasted (Units): none wasted  Patient tolerated procedure well without complications.   Reinjection is anticipated in 3 months.    

## 2021-02-28 ENCOUNTER — Other Ambulatory Visit: Payer: Self-pay | Admitting: Neurology

## 2021-03-11 ENCOUNTER — Telehealth: Payer: Self-pay | Admitting: Neurology

## 2021-03-11 MED ORDER — RIZATRIPTAN BENZOATE 10 MG PO TBDP: 10.0000 mg | ORAL_TABLET | ORAL | 3 refills | Status: DC | PRN

## 2021-03-11 NOTE — Telephone Encounter (Signed)
Patient currently has covid, and took the anti viral meds. She cant take her migraine meds for 8 days. She would like to know what she can take for her migraines in those 8 days.

## 2021-03-11 NOTE — Telephone Encounter (Signed)
Per Dr.Jaffe, We can prescribe her rizatriptan 10mg  - take 1 tablet earliest onset of migraine, may repeat after 2 hours.  Maximum 2 tablets in 24 hours.   Pt states she took Sumatriptan 100 mg and it did help.   [facility]

## 2021-03-31 ENCOUNTER — Telehealth: Payer: Self-pay

## 2021-03-31 NOTE — Telephone Encounter (Signed)
New message   Your demographic data has been sent to [facility] successfully!  [employer] typically takes 5-10 minutes to respond, but it may take a little longer in some cases. You will be notified by email when available. You can also check for an update later by opening this request from your dashboard. Please do not fax or call [employer] to resubmit this request. If you need assistance, please chat with CoverMyMeds or call us at [phone].  If it has been longer than 24 hours, please reach out to [employer].  Chaia Bissette Key: [id number] help? Call us at [phone] Status Sent to [facility] Botox 200UNIT solution Form [employer] Electronic PA Form [phone] NCPDP)

## 2021-03-31 NOTE — Telephone Encounter (Signed)
New message   Benefit Verification [id number] Submitted! For BV Basic submissions, please allow 2-4 business days for results.  For BV Full submissions, please allow 6 business days for results.

## 2021-04-01 ENCOUNTER — Other Ambulatory Visit: Payer: Self-pay | Admitting: Neurology

## 2021-04-02 ENCOUNTER — Telehealth: Payer: Self-pay

## 2021-04-02 NOTE — Telephone Encounter (Signed)
New message   Received approval letter from [facility]   Botox medication   Effective date  1.25.23 to  1.25.24  Fax to [facility] at  [phone]

## 2021-04-07 NOTE — Telephone Encounter (Signed)
BOTOX (onabotulinumtoxinA)  ACQUISITION - MAJOR MEDICAL BENEFITS List of Specialty Pharmacies may not include all available options. If preferred Specialty Pharmacy is not listed, check with payer.  [ ]  Buy and Bill [?]Buy and [place] Available [ ]  Specialty Pharmacy Required [ ]  Prescription Coverage Only - See Pharmacy Benefits Section [ ]  Payer will not release information Specialty Pharmacies: [facility] [phone] [facility] [phone]  [place] renewal date is 03/08/2022. This plan runs on a calendar year basis. Once the medical individual and family of pocket maximum has been met, the patient responsibility will be 0%. Beginning 03/09/2019, [facility] and [facility] stopped dispensing BOTOX for many medical and prescription benefit plans. Additionally, [facility] [employer] development officer) does not cover BOTOX for many prescription plans, even when dispensed by a non[facility]. When researching this patient's plan, BOTOX ONE requested alternative specialty pharmacy options, but the payer still reported [facility] and/or [facility] as valid options. Please contact your Reimbursement Business Advisor if you have questions regarding specialty pharmacy options. For additional payer coverage criteria, please reference the BOTOX Policies and Forms link available at [email or web address].

## 2021-04-23 NOTE — Telephone Encounter (Signed)
F/u  RE: Amy Yang approved your request for coverage of Botox.  Dear Amy Yang:  Amy Yang pleased to let you know that we've approved your or your doctor's request for coverage (sometimes called a prior authorization) for Botox. You can now fill your prescription, and it will be covered according to your plan.   As long as you remain covered by your prescription drug plan and there are no changes to your plan benefits, this request is approved from 04/01/2021 to 04/01/2022. When this approval expires, please speak to your doctor about your treatment.

## 2021-04-24 ENCOUNTER — Other Ambulatory Visit: Payer: Self-pay

## 2021-04-24 ENCOUNTER — Ambulatory Visit: Payer: Managed Care, Other (non HMO) | Admitting: Neurology

## 2021-04-24 DIAGNOSIS — G43709 Chronic migraine without aura, not intractable, without status migrainosus: Secondary | ICD-10-CM

## 2021-04-24 MED ORDER — ONABOTULINUMTOXINA 100 UNITS IJ SOLR
200.0000 [IU] | Freq: Once | INTRAMUSCULAR | Status: AC
  Administered 2021-04-24: 155 [IU] via INTRAMUSCULAR

## 2021-04-24 NOTE — Progress Notes (Signed)
[facility]  ° °Procedure Note Botox ° °Attending: Dr. Candra Wegner ° °Preoperative Diagnosis(es): Chronic migraine ° °Consent obtained from: The patient °Benefits discussed included, but were not limited to decreased muscle tightness, increased joint range of motion, and decreased pain.  Risk discussed included, but were not limited pain and discomfort, bleeding, bruising, excessive weakness, venous thrombosis, muscle atrophy and dysphagia.  Anticipated outcomes of the procedure as well as he risks and benefits of the alternatives to the procedure, and the roles and tasks of the personnel to be involved, were discussed with the patient, and the patient consents to the procedure and agrees to proceed. A copy of the patient medication guide was given to the patient which explains the blackbox warning. ° °Patients identity and treatment sites confirmed Yes.  . ° °Details of Procedure: °Skin was cleaned with alcohol. Prior to injection, the needle plunger was aspirated to make sure the needle was not within a blood vessel.  There was no blood retrieved on aspiration.   ° °Following is a summary of the muscles injected  And the amount of Botulinum toxin used: ° °Dilution °200 units of Botox was reconstituted with 4 ml of preservative free normal saline. °Time of reconstitution: At the time of the office visit (<30 minutes prior to injection)  ° °Injections  °155 total units of Botox was injected with a 30 gauge needle. ° °Injection Sites: °L occipitalis: 15 units- 3 sites  °R occiptalis: 15 units- 3 sites ° °L upper trapezius: 15 units- 3 sites °R upper trapezius: 15 units- 3 sits          °L paraspinal: 10 units- 2 sites °R paraspinal: 10 units- 2 sites ° °Face °L frontalis(2 injection sites):10 units   °R frontalis(2 injection sites):10 units         °L corrugator: 5 units   °R corrugator: 5 units           °Procerus: 5 units   °L temporalis: 20 units °R temporalis: 20 units  ° °Agent:  °200 units of botulinum Type  A (Onobotulinum Toxin type A) was reconstituted with 4 ml of preservative free normal saline.  °Time of reconstitution: At the time of the office visit (<30 minutes prior to injection)  ° ° ° Total injected (Units):  155 ° Total wasted (Units):  45 ° °Patient tolerated procedure well without complications.   °Reinjection is anticipated in 3 months. ° ° °

## 2021-05-03 ENCOUNTER — Other Ambulatory Visit: Payer: Self-pay | Admitting: Neurology

## 2021-05-18 NOTE — Progress Notes (Unsigned)
NEUROLOGY FOLLOW UP OFFICE NOTE  MINIYA MIGUEZ [id number]  Assessment/Plan:   Migraine without aura, without status migrainosus, intractable    1.  Migraine prevention:  Botox, Trokendi XR 100mg  QHS 2.  Migraine rescue:  Will have her try Elyxyb as a first line.  May take Ubrelvy or Nurtec as second line (she will tell me if either more effective). Zofran for nausea. 3.  Limit use of pain relievers to no more than 2 days out of week to prevent risk of rebound or medication-overuse headache. 4.  Keep headache diary 5.  Follow up for next botox and in office in 6 months.   Subjective:  Shine Scrogham is a 46 year old Caucasian woman who follows up for migraines.   UPDATE: Intensity:  *** Duration:  *** Frequency:  ***   Ubrelvy?  Nurtec?  Rizatriptan?  Elyxyb effective but no longer on the market.   Current NSAIDS:  Advil 600mg , Elyxyb Current analgesics:  Fioricet (takes 2 days a week at most) Current triptans:  Treximet Current anti-emetic:  Zofran ODT 4mg  Current muscle relaxants:  none Current anti-anxiolytic:  none Current sleep aide:  none Current Antihypertensive medications:  none Current Antidepressant medications:  none Current Anticonvulsant medications:  Trokendi XR 100mg  at bedtime Current anti-CGRP:  Ubrelvy 100mg  Current Vitamins/Herbal/Supplements:  Magnesium, vitamin B6 Current Antihistamines/Decongestants:  Benadryl Current hormone/birth control:  Mirena, Acutane Other therapy:  Botox, Acupuncture   Caffeine:  Highly sensitive to caffeine and headache trigger.  Alcohol:  no Smoker:  no Diet:  Needs to increase water intake Exercise:  routine Depression/anxiety:  okay Sleep hygiene:  ok.  Son sleeping through night.   HISTORY: Onset:  Since 5th grade Location:  Either side Quality:  Throbbing/stabbing Initial Intensity:  8/10 Aura:  no Prodrome:  no Associated symptoms:  Nausea, photophobia.  Some blurred vision.  No vomiting.  She has not had any  new worse headache of her life, waking up from sleep Initial Duration:  Up to 48 hours Initial Frequency:  6 days a month of severe migraine but has daily headache Initial Frequency of abortive medication: only as needed. Triggers:  Change in weather, hormonal/menstrual cycle, caffeine, MSG Relieving factors:  Massage, ice, Tylenol, Bendaryl Activity: aggravates headache.  Needs to lay down for severe migraines. She does have daily bi-temporal non-throbbing vice-like headache as well.   Past NSAIDS:  Ibuprofen, naproxen Past analgesics:  Tramadol Past abortive triptans:  Sumatriptan (tablet/NS/[place]), Relpax, Zomig.  Treximet (most effective). Past ergot:  Trudhesa Past anti-emetic:  Reglan Past antihypertensive medications: labetolol (low blood pressure) Past antidepressant medications:  Nortriptyline, venlafaxine Past anticonvulsant medications:  Topiramate 100mg  (effective but side effects) Past CGRP inhibitor:  Nurtec (moderate efficacy) Past vitamins/Herbal/Supplements:  no Other past therapies:   trigger point injections, biofeedback  Family history of headache:  No  PAST MEDICAL HISTORY: Past Medical History:  Diagnosis Date   Bronchitis    Encounter for infertility    Kidney stones    Kidney stones    Migraine    Pneumonia    Preterm labor     MEDICATIONS: Current Outpatient Medications on File Prior to Visit  Medication Sig Dispense Refill   BOTOX 200 units SOLR INJECT 155 UNITS INTO THE MUSCLES OF MULTIPLE SITES OF THE HEAD, NECK AND FACE EVERY 90 DAYS (DISCARD UNUSED PORTION) 1 each 4   Celecoxib (ELYXYB) 120 MG/4.8ML SOLN Take 120 mg by mouth daily as needed (Maximum 1 in 24 hours). 28.8 mL 5  ibuprofen (ADVIL,MOTRIN) 600 MG tablet Take 1 tablet (600 mg total) by mouth every 6 (six) hours. (Patient not taking: Reported on 08/07/2020) 30 tablet 0   ISOtretinoin (ACCUTANE) 30 MG capsule Take 30 mg by mouth 2 (two) times daily.     ondansetron (ZOFRAN ODT) 4 MG  disintegrating tablet Take 1 tablet (4 mg total) by mouth every 8 (eight) hours as needed for nausea or vomiting. 15 tablet 0   pantoprazole (PROTONIX) 20 MG tablet Take 1 tablet (20 mg total) by mouth daily. (Patient not taking: Reported on 08/07/2020) 30 tablet 0   Prenatal Vit-Fe Fumarate-FA (PRENATAL VITAMIN PO) Take 1 tablet by mouth daily.  (Patient not taking: Reported on 08/07/2020)     rizatriptan (MAXALT-MLT) 10 MG disintegrating tablet Take 1 tablet (10 mg total) by mouth as needed for migraine. May repeat in 2 hours if needed 9 tablet 3   Topiramate ER (TROKENDI XR) 100 MG CP24 Take 100 mg by mouth at bedtime. 30 capsule 5   UBRELVY 100 MG TABS TAKE 1 TABLET BY MOUTH AS NEEDED (MAY REPEAT DOSE IN 2 HOURS. MAXIMUM 2 TABLETS IN 24 HOURS). 10 tablet 0   valACYclovir (VALTREX) 1000 MG tablet Take 1,000 mg by mouth 2 (two) times daily as needed.     [DISCONTINUED] SUMAtriptan-naproxen (TREXIMET) 85-500 MG tablet Take 1 tablet by mouth every 2 (two) hours as needed for migraine. Maximum 2 tablets in 24 hours.  Hold breastfeeding for 12 hours after use. (Patient not taking: Reported on 02/06/2020) 10 tablet 2   No current facility-administered medications on file prior to visit.    ALLERGIES: Allergies  Allergen Reactions   Other Anaphylaxis   Latex    Morphine And Related Nausea And Vomiting   Pineapple    Rhinocort [Budesonide]    Symbicort [Budesonide-Formoterol Fumarate] Swelling    FAMILY HISTORY: Family History  Problem Relation Age of Onset   Heart disease Father    Asthma Sister    Cancer Maternal Grandmother    Cancer Maternal Grandfather    Cancer Paternal Grandmother    Diabetes Paternal Grandmother    Stroke Paternal Grandfather       Objective:  *** General: No acute distress.  Patient appears ***-groomed.   Head:  Normocephalic/atraumatic Eyes:  Fundi examined but not visualized Neck: supple, no paraspinal tenderness, full range of motion Heart:  Regular rate  and rhythm Lungs:  Clear to auscultation bilaterally Back: No paraspinal tenderness Neurological Exam: alert and oriented to person, place, and time.  Speech fluent and not dysarthric, language intact.  CN II-XII intact. Bulk and tone normal, muscle strength 5/5 throughout.  Sensation to light touch intact.  Deep tendon reflexes 2+ throughout, toes downgoing.  Finger to nose testing intact.  Gait normal, Romberg negative.   Shon Millet, DO  CC: ***

## 2021-05-19 ENCOUNTER — Other Ambulatory Visit: Payer: Self-pay

## 2021-05-19 ENCOUNTER — Ambulatory Visit: Payer: Managed Care, Other (non HMO) | Admitting: Neurology

## 2021-05-19 ENCOUNTER — Encounter: Payer: Self-pay | Admitting: Neurology

## 2021-05-19 VITALS — BP 112/73 | HR 66 | Ht 67.0 in | Wt 139.6 lb

## 2021-05-19 DIAGNOSIS — G43009 Migraine without aura, not intractable, without status migrainosus: Secondary | ICD-10-CM

## 2021-05-19 MED ORDER — AIMOVIG 140 MG/ML ~~LOC~~ SOAJ: 140.0000 mg | SUBCUTANEOUS | 11 refills | Status: DC

## 2021-05-19 MED ORDER — UBRELVY 100 MG PO TABS: 1.0000 | ORAL_TABLET | ORAL | 11 refills | Status: DC | PRN

## 2021-05-19 NOTE — Patient Instructions (Signed)
In addition to Botox, will try to start Aimovig 140mg  every 28 days ?Ubrelvy and Zofran for nausea ?Keep headache headache ?Follow up for next botox ?Follow up for next office visit in one year ?

## 2021-06-03 ENCOUNTER — Other Ambulatory Visit: Payer: Self-pay | Admitting: Neurology

## 2021-07-24 ENCOUNTER — Telehealth: Payer: Self-pay | Admitting: Neurology

## 2021-07-24 ENCOUNTER — Ambulatory Visit: Payer: Managed Care, Other (non HMO) | Admitting: Neurology

## 2021-07-24 DIAGNOSIS — G43009 Migraine without aura, not intractable, without status migrainosus: Secondary | ICD-10-CM | POA: Diagnosis not present

## 2021-07-24 MED ORDER — ONABOTULINUMTOXINA 100 UNITS IJ SOLR
200.0000 [IU] | Freq: Once | INTRAMUSCULAR | Status: AC
  Administered 2021-07-24: 155 [IU] via INTRAMUSCULAR

## 2021-07-24 NOTE — Progress Notes (Signed)
[facility]  ° °Procedure Note Botox ° °Attending: Dr. Simora Dingee ° °Preoperative Diagnosis(es): Chronic migraine ° °Consent obtained from: The patient °Benefits discussed included, but were not limited to decreased muscle tightness, increased joint range of motion, and decreased pain.  Risk discussed included, but were not limited pain and discomfort, bleeding, bruising, excessive weakness, venous thrombosis, muscle atrophy and dysphagia.  Anticipated outcomes of the procedure as well as he risks and benefits of the alternatives to the procedure, and the roles and tasks of the personnel to be involved, were discussed with the patient, and the patient consents to the procedure and agrees to proceed. A copy of the patient medication guide was given to the patient which explains the blackbox warning. ° °Patients identity and treatment sites confirmed Yes.  . ° °Details of Procedure: °Skin was cleaned with alcohol. Prior to injection, the needle plunger was aspirated to make sure the needle was not within a blood vessel.  There was no blood retrieved on aspiration.   ° °Following is a summary of the muscles injected  And the amount of Botulinum toxin used: ° °Dilution °200 units of Botox was reconstituted with 4 ml of preservative free normal saline. °Time of reconstitution: At the time of the office visit (<30 minutes prior to injection)  ° °Injections  °155 total units of Botox was injected with a 30 gauge needle. ° °Injection Sites: °L occipitalis: 15 units- 3 sites  °R occiptalis: 15 units- 3 sites ° °L upper trapezius: 15 units- 3 sites °R upper trapezius: 15 units- 3 sits          °L paraspinal: 10 units- 2 sites °R paraspinal: 10 units- 2 sites ° °Face °L frontalis(2 injection sites):10 units   °R frontalis(2 injection sites):10 units         °L corrugator: 5 units   °R corrugator: 5 units           °Procerus: 5 units   °L temporalis: 20 units °R temporalis: 20 units  ° °Agent:  °200 units of botulinum Type  A (Onobotulinum Toxin type A) was reconstituted with 4 ml of preservative free normal saline.  °Time of reconstitution: At the time of the office visit (<30 minutes prior to injection)  ° ° ° Total injected (Units):  155 ° Total wasted (Units):  45 ° °Patient tolerated procedure well without complications.   °Reinjection is anticipated in 3 months. ° ° °

## 2021-07-24 NOTE — Telephone Encounter (Signed)
Pt came in today for her Botox injection. Her next visit was scheduled for 10/23/21. She will be out of town that day. She did not want to wait until 11/20/21 to have another injection. She is requesting to be able to have her botox injection another day.

## 2021-08-13 IMAGING — CR DG CHEST 2V
2 series · 2 of 2 positions shown · non-contrast
Comparison: None.

CLINICAL DATA: Shortness of breath, cough.

EXAM:
CHEST - 2 VIEW

[chest pa]
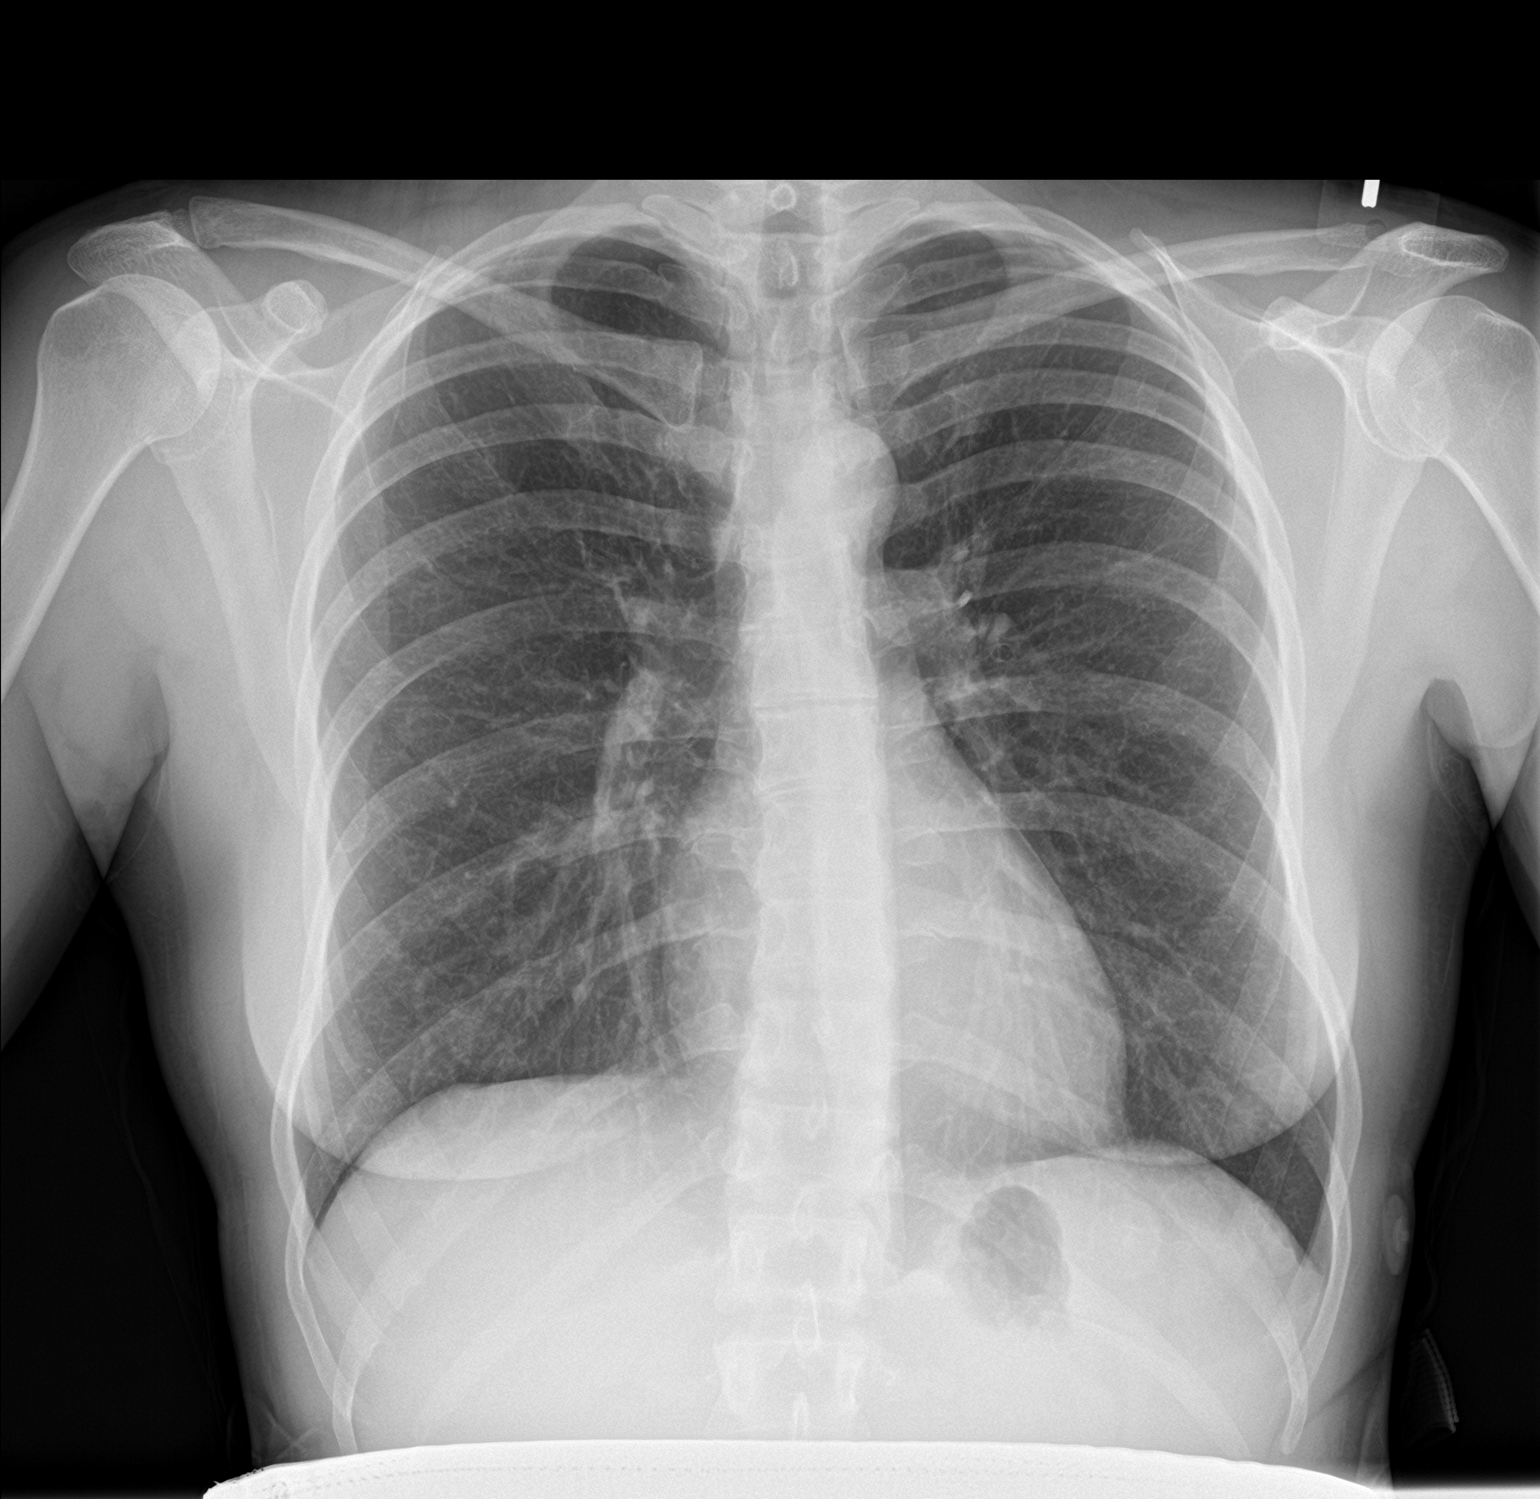

[chest lat]
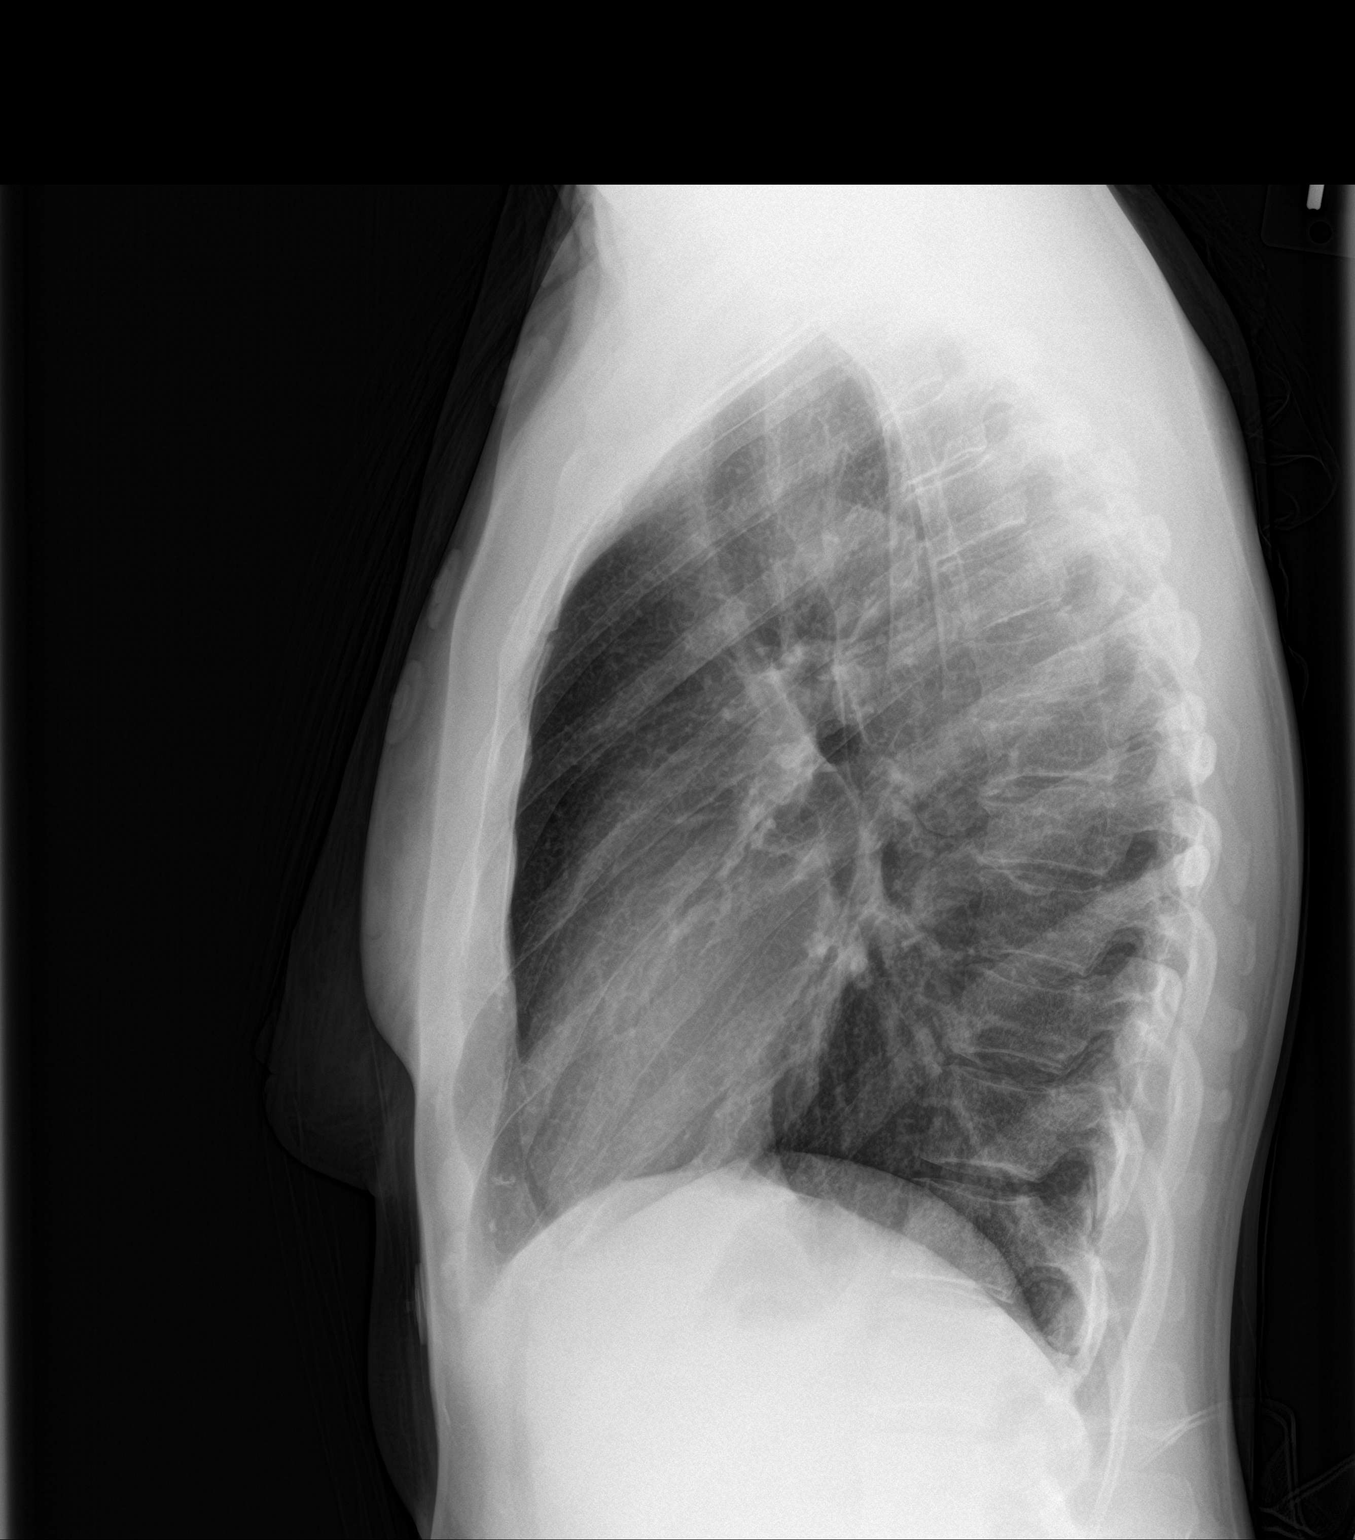

[2 of 2 positions shown; findings below may reference images not displayed]

FINDINGS: The heart size and mediastinal contours are within normal limits.
Both lungs are clear. No pneumothorax or pleural effusion is noted.
The visualized skeletal structures are unremarkable.
IMPRESSION: No active cardiopulmonary disease.

## 2021-10-23 ENCOUNTER — Ambulatory Visit: Payer: Managed Care, Other (non HMO) | Admitting: Neurology

## 2021-11-05 ENCOUNTER — Ambulatory Visit: Payer: Managed Care, Other (non HMO) | Admitting: Neurology

## 2021-11-05 DIAGNOSIS — G43709 Chronic migraine without aura, not intractable, without status migrainosus: Secondary | ICD-10-CM | POA: Diagnosis not present

## 2021-11-05 MED ORDER — ONABOTULINUMTOXINA 100 UNITS IJ SOLR
200.0000 [IU] | Freq: Once | INTRAMUSCULAR | Status: AC
  Administered 2021-11-05: 155 [IU] via INTRAMUSCULAR

## 2021-11-05 NOTE — Progress Notes (Signed)
[facility]  ° °Procedure Note Botox ° °Attending: Dr. Gerilyn Stargell ° °Preoperative Diagnosis(es): Chronic migraine ° °Consent obtained from: The patient °Benefits discussed included, but were not limited to decreased muscle tightness, increased joint range of motion, and decreased pain.  Risk discussed included, but were not limited pain and discomfort, bleeding, bruising, excessive weakness, venous thrombosis, muscle atrophy and dysphagia.  Anticipated outcomes of the procedure as well as he risks and benefits of the alternatives to the procedure, and the roles and tasks of the personnel to be involved, were discussed with the patient, and the patient consents to the procedure and agrees to proceed. A copy of the patient medication guide was given to the patient which explains the blackbox warning. ° °Patients identity and treatment sites confirmed Yes.  . ° °Details of Procedure: °Skin was cleaned with alcohol. Prior to injection, the needle plunger was aspirated to make sure the needle was not within a blood vessel.  There was no blood retrieved on aspiration.   ° °Following is a summary of the muscles injected  And the amount of Botulinum toxin used: ° °Dilution °200 units of Botox was reconstituted with 4 ml of preservative free normal saline. °Time of reconstitution: At the time of the office visit (<30 minutes prior to injection)  ° °Injections  °155 total units of Botox was injected with a 30 gauge needle. ° °Injection Sites: °L occipitalis: 15 units- 3 sites  °R occiptalis: 15 units- 3 sites ° °L upper trapezius: 15 units- 3 sites °R upper trapezius: 15 units- 3 sits          °L paraspinal: 10 units- 2 sites °R paraspinal: 10 units- 2 sites ° °Face °L frontalis(2 injection sites):10 units   °R frontalis(2 injection sites):10 units         °L corrugator: 5 units   °R corrugator: 5 units           °Procerus: 5 units   °L temporalis: 20 units °R temporalis: 20 units  ° °Agent:  °200 units of botulinum Type  A (Onobotulinum Toxin type A) was reconstituted with 4 ml of preservative free normal saline.  °Time of reconstitution: At the time of the office visit (<30 minutes prior to injection)  ° ° ° Total injected (Units):  155 ° Total wasted (Units):  45 ° °Patient tolerated procedure well without complications.   °Reinjection is anticipated in 3 months. ° ° °

## 2021-11-13 ENCOUNTER — Other Ambulatory Visit: Payer: Self-pay | Admitting: Neurology

## 2022-01-12 ENCOUNTER — Telehealth: Payer: Self-pay | Admitting: Anesthesiology

## 2022-01-12 DIAGNOSIS — G43709 Chronic migraine without aura, not intractable, without status migrainosus: Secondary | ICD-10-CM

## 2022-01-12 NOTE — Telephone Encounter (Signed)
Patient called to say that [facility] has been trying to contact Dr Tomi Likens about the patient's Botox Rx and they have not been able to.

## 2022-01-13 MED ORDER — BOTOX 200 UNITS IJ SOLR: INTRAMUSCULAR | 4 refills | Status: DC

## 2022-01-13 NOTE — Telephone Encounter (Signed)
Per Patient new script needed for Botox.  Reorder for Botox sent to [facility].

## 2022-01-13 NOTE — Telephone Encounter (Signed)
PE rLOri at [facility], We are needing a new rx. The one on file is showing to have expired on 11/10/2021.   I advised,NEw script sent  Amy Yang It is not showing on this id. I do see that she has a second one. 2-3 min while I get it pulled up and check there. You  Amy Yang There is nothing at all on that one. I pulled this one back up and now it is showing. It is processing and looks like it was marked to rush. The typical turn around time is 5-8 business days, but this is moving quickly and I doubt it will take that long. As soon as it is ready, we will start reaching out to the pt and she can provide her shipping consent for you to schedule at that time.  You okay thank you  Amy Yang You are Very Welcome. Is there anyone else I can assist you with today?

## 2022-01-14 ENCOUNTER — Other Ambulatory Visit: Payer: Self-pay

## 2022-01-14 NOTE — Telephone Encounter (Signed)
Per [facility] rep, Amy Yang Thank you for your patience. Patient's Botox prescription was received on 01/13/22 and is processing.

## 2022-01-18 NOTE — Telephone Encounter (Signed)
CMA, Good morning, I reaching out to try and get a Botox delivered. The system will not allow me to do so. Message says New RX needed. But I was advised last week that the script was recevied. Patient Amy Yang 12/28/1975, Zip: [phone] Hello! A representative will be with you shortly. JENNIFER has joined the conversation. JENNIFER Good morning! This is Victorino Dike, I'm happy to assist you today. JENNIFER Let's take a look. Please allow me up to 3-4 minutes to take a look. JENNIFER I see that the RX was received on 11/8 and is processing, another RX is NOT needed. You Why does the message say one is needed. Her appt is 02/04/22 so I need to make sure we receive JENNIFER I was received and processing, I do apologize, the portal is just not updated yet. You okay, Can we put a note that her appt is on 02/04/22 please

## 2022-02-04 ENCOUNTER — Ambulatory Visit: Payer: Managed Care, Other (non HMO) | Admitting: Neurology

## 2022-02-04 ENCOUNTER — Other Ambulatory Visit: Payer: Self-pay | Admitting: Neurology

## 2022-02-04 DIAGNOSIS — G43709 Chronic migraine without aura, not intractable, without status migrainosus: Secondary | ICD-10-CM | POA: Diagnosis not present

## 2022-02-04 MED ORDER — EMGALITY 120 MG/ML ~~LOC~~ SOAJ: 120.0000 mg | SUBCUTANEOUS | 11 refills | Status: DC

## 2022-02-04 MED ORDER — ONABOTULINUMTOXINA 100 UNITS IJ SOLR
200.0000 [IU] | Freq: Once | INTRAMUSCULAR | Status: AC
  Administered 2022-02-04: 155 [IU] via INTRAMUSCULAR

## 2022-02-04 NOTE — Progress Notes (Signed)
Insurance will no longer be approving Aimovig beginning in January.  Will switch to [employer]  Provided samples for loading dose.  Standing order sent to her pharmacy

## 2022-02-04 NOTE — Progress Notes (Signed)
[facility]  ° °Procedure Note Botox ° °Attending: Dr. Stachia Slutsky ° °Preoperative Diagnosis(es): Chronic migraine ° °Consent obtained from: The patient °Benefits discussed included, but were not limited to decreased muscle tightness, increased joint range of motion, and decreased pain.  Risk discussed included, but were not limited pain and discomfort, bleeding, bruising, excessive weakness, venous thrombosis, muscle atrophy and dysphagia.  Anticipated outcomes of the procedure as well as he risks and benefits of the alternatives to the procedure, and the roles and tasks of the personnel to be involved, were discussed with the patient, and the patient consents to the procedure and agrees to proceed. A copy of the patient medication guide was given to the patient which explains the blackbox warning. ° °Patients identity and treatment sites confirmed Yes.  . ° °Details of Procedure: °Skin was cleaned with alcohol. Prior to injection, the needle plunger was aspirated to make sure the needle was not within a blood vessel.  There was no blood retrieved on aspiration.   ° °Following is a summary of the muscles injected  And the amount of Botulinum toxin used: ° °Dilution °200 units of Botox was reconstituted with 4 ml of preservative free normal saline. °Time of reconstitution: At the time of the office visit (<30 minutes prior to injection)  ° °Injections  °155 total units of Botox was injected with a 30 gauge needle. ° °Injection Sites: °L occipitalis: 15 units- 3 sites  °R occiptalis: 15 units- 3 sites ° °L upper trapezius: 15 units- 3 sites °R upper trapezius: 15 units- 3 sits          °L paraspinal: 10 units- 2 sites °R paraspinal: 10 units- 2 sites ° °Face °L frontalis(2 injection sites):10 units   °R frontalis(2 injection sites):10 units         °L corrugator: 5 units   °R corrugator: 5 units           °Procerus: 5 units   °L temporalis: 20 units °R temporalis: 20 units  ° °Agent:  °200 units of botulinum Type  A (Onobotulinum Toxin type A) was reconstituted with 4 ml of preservative free normal saline.  °Time of reconstitution: At the time of the office visit (<30 minutes prior to injection)  ° ° ° Total injected (Units):  155 ° Total wasted (Units):  45 ° °Patient tolerated procedure well without complications.   °Reinjection is anticipated in 3 months. ° ° °

## 2022-02-09 ENCOUNTER — Other Ambulatory Visit: Payer: Self-pay | Admitting: Neurology

## 2022-03-23 ENCOUNTER — Telehealth: Payer: Self-pay | Admitting: Neurology

## 2022-03-23 NOTE — Telephone Encounter (Signed)
LMOVM for  patient, She will need to make an appointment to discuss

## 2022-03-23 NOTE — Telephone Encounter (Signed)
Pt called in stating she has been having episodes of extreme exhaustion. She says she has gone to her PCP and her blood work was fine. She isn't able to drive during these episodes. She has gotten one while driving and doesn't remember getting home. She feels this could be neurologic.

## 2022-03-26 ENCOUNTER — Telehealth: Payer: Self-pay

## 2022-03-26 NOTE — Telephone Encounter (Signed)
PER [facility] PA needed for BOtox

## 2022-03-29 NOTE — Progress Notes (Signed)
NEUROLOGY FOLLOW UP OFFICE NOTE  Amy Yang [id number]  Assessment/Plan:   1  Episodes of severe somnolence.  Unclear etiology.  Uncertain if this is a chronic post-viral etiology or sleep disorder (narcolepsy?).   2  Migraine without aura, without status migrainosus, intractable    1.  For further evaluation of these episodes of somnolence and other symptoms:  - Check labs:  B1, B12, vit D, Lyme  - Check MRI of brain with and without contrast  - Consider referral to sleep medicine 4.  Migraine management:  - Migraine prevention:  Botox, Trokendi XR 100mg  QHS  - Migraine rescue:  Ubrelvy 100mg , Zofran  - Limit use of pain relievers to no more than 2 days out of week to prevent risk of rebound or medication-overuse headache.  - Keep headache diary 5.  Follow up after testing   Subjective:  Amy Yang is a 47 year old Caucasian woman who follows up for migraines as well as new episodes of extreme somnolence.  She is accompanied by her husband who supplements history.   UPDATE: Migraines have been well controlled.  She has no more than 2 a month lasting a couple of hours with Ubrelvy.  In early December, she was treated with antibiotics for bronchitis.  In mid-December, she was sitting in church when she suddenly developed dry mouth and felt somnolent, easily falling asleep.  She had to excuse herself to drink water.  Symptoms lasted an hour.  She had two other similar episodes later in December.  On 1/3, she was driving to the office when she had another episode.  She developed dry mouth and had trouble staying awake.  She made it to work but does not recall the drive.  They started becoming daily.  They usually will start 40 to 90 minutes after waking up in the morning.  She feels fine when she wakes up but will then suddenly feel somnolent with dry mouth.  She also will have blurred and spotty vision.  She also notes associated vertigo and lightheadedness with sensation that she may  pass out.  Sometimes her scalp will tingle.  She feels weak and out of breath. She also endorses ringing in her ears.  Symptoms tend to last 90 minutes.  She once had left arm numbness and another time right leg heaviness, numbness and tingling for 10-15 seconds after the episode resolved.  As these episodes occur in the morning, she moved working out in the afternoons.  She notes weakness and more difficulty using her weights.  She also notes mild joint pain but no myalgias.  There is no associated headache and they are not associated with her migraines.  A couple of times, she has had difficulty sleeping because her heart starting racing. She started taking naps during the day, usually for an hour.  However, on the weekends, she has napped for several hours and she is still able to sleep through the night.  Denies drop attacks or sleep paralysis.    On Sunday night, she developed onset of ringing in the ears which have been persistent ever since.    PCP check labs, CBC, CMP and TSH, which were unremarkable.  Current NSAIDS:  Advil 600mg  Current analgesics:  none Current triptans:  none Current anti-emetic:  Zofran ODT 4mg  Current muscle relaxants:  none Current anti-anxiolytic:  none Current sleep aide:  none Current Antihypertensive medications:  none Current Antidepressant medications:  none Current Anticonvulsant medications:  Trokendi XR 100mg  at  bedtime Current anti-CGRP:  Ubrelvy 100mg  Current Vitamins/Herbal/Supplements:  Magnesium, vitamin B6 Current Antihistamines/Decongestants:  Benadryl Current hormone/birth control:  Mirena, Acutane Other therapy:  Botox, Acupuncture   Caffeine:  Highly sensitive to caffeine and headache trigger.  Alcohol:  no Smoker:  no Diet:  Has been eliminating possible food triggers and slowly reintroducing them Exercise:  routine Depression/anxiety:  okay Sleep hygiene:  ok.  Son sleeping through night.   HISTORY: Onset:  Since 5th grade Location:   Either side Quality:  Throbbing/stabbing Initial Intensity:  8/10 Aura:  no Prodrome:  no Associated symptoms:  Nausea, photophobia.  Some blurred vision.  No vomiting.  She has not had any new worse headache of her life, waking up from sleep Initial Duration:  Up to 48 hours Initial Frequency:  6 days a month of severe migraine but has daily headache Initial Frequency of abortive medication: only as needed. Triggers:  Change in weather, hormonal/menstrual cycle, caffeine, MSG Relieving factors:  Massage, ice, Tylenol, Bendaryl Activity: aggravates headache.  Needs to lay down for severe migraines. She does have daily bi-temporal non-throbbing vice-like headache as well.   Past NSAIDS:  Ibuprofen, naproxen Past analgesics:  Tramadol, Fioricet Past abortive triptans:  Sumatriptan (tablet/NS/Gowen), Relpax, Zomig.  Treximet (most effective), rizatriptan Past ergot:  Trudhesa Past anti-emetic:  Reglan Past antihypertensive medications: labetolol (low blood pressure) Past antidepressant medications:  Nortriptyline, venlafaxine Past anticonvulsant medications:  Topiramate 100mg  (effective but side effects) Past CGRP inhibitor:  Nurtec (moderate efficacy) Past vitamins/Herbal/Supplements:  no Other past therapies:   trigger point injections, biofeedback  Family history of headache:  No  PAST MEDICAL HISTORY: Past Medical History:  Diagnosis Date   Bronchitis    Encounter for infertility    Kidney stones    Kidney stones    Migraine    Pneumonia    Preterm labor     MEDICATIONS: Current Outpatient Medications on File Prior to Visit  Medication Sig Dispense Refill   botulinum toxin Type A (BOTOX) 200 units injection INJECT 155 UNITS INTO THE MUSCLES OF MULTIPLE SITES OF THE HEAD, NECK AND FACE EVERY 90 DAYS (DISCARD UNUSED PORTION) 1 each 4   Celecoxib (ELYXYB) 120 MG/4.8ML SOLN Take 120 mg by mouth daily as needed (Maximum 1 in 24 hours). 28.8 mL 5   Galcanezumab-gnlm (EMGALITY)  120 MG/ML SOAJ Inject 120 mg into the skin every 28 (twenty-eight) days. 1.12 mL 11   ibuprofen (ADVIL,MOTRIN) 600 MG tablet Take 1 tablet (600 mg total) by mouth every 6 (six) hours. (Patient not taking: Reported on 08/07/2020) 30 tablet 0   ondansetron (ZOFRAN ODT) 4 MG disintegrating tablet Take 1 tablet (4 mg total) by mouth every 8 (eight) hours as needed for nausea or vomiting. 15 tablet 0   tiZANidine (ZANAFLEX) 4 MG capsule Take 4 mg by mouth 3 (three) times daily.     Topiramate ER (TROKENDI XR) 100 MG CP24 TAKE 1 CAPSULE BY MOUTH EVERYDAY AT BEDTIME 90 capsule 1   Ubrogepant (UBRELVY) 100 MG TABS Take 1 tablet by mouth as needed (May repeat dose in 2 hours.  Maximum 2 tablets in 24 hours). 10 tablet 11   valACYclovir (VALTREX) 1000 MG tablet Take 1,000 mg by mouth 2 (two) times daily as needed.     [DISCONTINUED] SUMAtriptan-naproxen (TREXIMET) 85-500 MG tablet Take 1 tablet by mouth every 2 (two) hours as needed for migraine. Maximum 2 tablets in 24 hours.  Hold breastfeeding for 12 hours after use. (Patient not taking: Reported on 02/06/2020)  10 tablet 2   No current facility-administered medications on file prior to visit.    ALLERGIES: Allergies  Allergen Reactions   Other Anaphylaxis   Latex    Morphine And Related Nausea And Vomiting   Pineapple    Rhinocort [Budesonide]    Symbicort [Budesonide-Formoterol Fumarate] Swelling    FAMILY HISTORY: Family History  Problem Relation Age of Onset   Heart disease Father    Asthma Sister    Cancer Maternal Grandmother    Cancer Maternal Grandfather    Cancer Paternal Grandmother    Diabetes Paternal Grandmother    Stroke Paternal Grandfather       Objective:  Blood pressure 108/63, pulse (!) 54, height 5\' 8"  (1.727 m), weight 139 lb 9.6 oz (63.3 kg), SpO2 97 %, unknown if currently breastfeeding. General: No acute distress.  Patient appears well-groomed.   Head:  Normocephalic/atraumatic Eyes:  Fundi examined but not  visualized Neck: supple, mild paraspinal tenderness, full range of motion Heart:  Regular rate and rhythm Lungs:  Clear to auscultation bilaterally Back: No paraspinal tenderness Neurological Exam: alert and oriented to person, place, and time.  Speech fluent and not dysarthric, language intact.  CN II-XII intact. Bulk and tone normal, muscle strength 5/5 throughout.  Sensation to light touch intact.  Deep tendon reflexes 2+ throughout, toes downgoing.  Finger to nose testing intact.  Gait normal, Romberg negative.   Metta Clines, DO  CC: Early Osmond, MD

## 2022-03-30 ENCOUNTER — Encounter: Payer: Self-pay | Admitting: Neurology

## 2022-03-30 ENCOUNTER — Ambulatory Visit: Payer: Managed Care, Other (non HMO) | Admitting: Neurology

## 2022-03-30 ENCOUNTER — Other Ambulatory Visit: Payer: Self-pay | Admitting: Neurology

## 2022-03-30 ENCOUNTER — Other Ambulatory Visit (INDEPENDENT_AMBULATORY_CARE_PROVIDER_SITE_OTHER): Payer: Managed Care, Other (non HMO)

## 2022-03-30 VITALS — BP 108/63 | HR 54 | Ht 68.0 in | Wt 139.6 lb

## 2022-03-30 DIAGNOSIS — R202 Paresthesia of skin: Secondary | ICD-10-CM

## 2022-03-30 DIAGNOSIS — R200 Anesthesia of skin: Secondary | ICD-10-CM | POA: Diagnosis not present

## 2022-03-30 DIAGNOSIS — M6281 Muscle weakness (generalized): Secondary | ICD-10-CM

## 2022-03-30 DIAGNOSIS — R400 Somnolence: Secondary | ICD-10-CM | POA: Diagnosis not present

## 2022-03-30 DIAGNOSIS — H538 Other visual disturbances: Secondary | ICD-10-CM | POA: Diagnosis not present

## 2022-03-30 LAB — VITAMIN D 25 HYDROXY (VIT D DEFICIENCY, FRACTURES): VITD: 38.29 ng/mL (ref 30.00–100.00)

## 2022-03-30 LAB — VITAMIN B12: Vitamin B-12: 883 pg/mL (ref 211–911)

## 2022-03-30 NOTE — Patient Instructions (Signed)
Check B1, B12, vit D, Lyme Check MRI of brain with and without contrast Further recommendations pending results.  May need to consider referral to sleep medicine

## 2022-03-31 ENCOUNTER — Ambulatory Visit
Admission: RE | Admit: 2022-03-31 | Discharge: 2022-03-31 | Disposition: A | Discharge status: Home / Self Care | Payer: Managed Care, Other (non HMO) | Source: Ambulatory Visit | Attending: Neurology | Admitting: Neurology

## 2022-03-31 ENCOUNTER — Telehealth: Payer: Self-pay

## 2022-03-31 DIAGNOSIS — H538 Other visual disturbances: Secondary | ICD-10-CM

## 2022-03-31 DIAGNOSIS — R200 Anesthesia of skin: Secondary | ICD-10-CM

## 2022-03-31 DIAGNOSIS — R400 Somnolence: Secondary | ICD-10-CM

## 2022-03-31 DIAGNOSIS — M6281 Muscle weakness (generalized): Secondary | ICD-10-CM

## 2022-03-31 MED ORDER — GADOPICLENOL 0.5 MMOL/ML IV SOLN
7.0000 mL | Freq: Once | INTRAVENOUS | Status: AC | PRN
  Administered 2022-03-31: 7 mL via INTRAVENOUS

## 2022-03-31 NOTE — Telephone Encounter (Signed)
Called patient and left message that Dr. Tomi Likens is waiting on the rest of the labs to come in before setting up a treatment plan at this time

## 2022-03-31 NOTE — Progress Notes (Signed)
Chelsea lvm for patient.

## 2022-03-31 NOTE — Telephone Encounter (Signed)
Patient is calling in, she has seen the results of MRI posted and Dr.Jaffe's response. Amy Yang would just like to know what the next steps are going to be.

## 2022-04-01 ENCOUNTER — Telehealth: Payer: Self-pay | Admitting: Neurology

## 2022-04-01 DIAGNOSIS — G4710 Hypersomnia, unspecified: Secondary | ICD-10-CM

## 2022-04-01 LAB — LYME DISEASE, WESTERN BLOT
IgG P18 Ab.: ABSENT
IgG P23 Ab.: ABSENT
IgG P28 Ab.: ABSENT
IgG P30 Ab.: ABSENT
IgG P39 Ab.: ABSENT
IgG P45 Ab.: ABSENT
IgG P66 Ab.: ABSENT
IgG P93 Ab.: ABSENT
IgM P23 Ab.: ABSENT
IgM P39 Ab.: ABSENT
IgM P41 Ab.: ABSENT
Lyme IgG Wb: NEGATIVE
Lyme IgM Wb: NEGATIVE

## 2022-04-01 NOTE — Telephone Encounter (Signed)
Error

## 2022-04-01 NOTE — Telephone Encounter (Signed)
Patient is returning a call to our office  please call

## 2022-04-01 NOTE — Telephone Encounter (Signed)
Called patient and left message that Dr. Tomi Likens is still waiting on labs to come back before determining a treatment plan if one is required

## 2022-04-02 LAB — VITAMIN B1: Vitamin B1 (Thiamine): 17 nmol/L (ref 8–30)

## 2022-04-05 NOTE — Progress Notes (Signed)
See encounter note 04/05/22.

## 2022-04-05 NOTE — Telephone Encounter (Signed)
MRI of brain is unremarkable.  There are a few tiny spots seen, which is nonspecific and often seen in patients with migraines.  But nothing concerning that would explain the symptoms.  B12 and vit D levels look okay.  Other labs are still pending. Labs are unremarkable.  We talked about sleep referral.  I would like to actually check an EEG first.     LMVOM for patient to call th office back.

## 2022-04-05 NOTE — Telephone Encounter (Signed)
Patient called and wants to get the results back from a number of test that she has had done  please call patient

## 2022-04-05 NOTE — Telephone Encounter (Signed)
Patient advised of results. Transferred to the front desk to schedule EEG.

## 2022-04-07 ENCOUNTER — Ambulatory Visit (INDEPENDENT_AMBULATORY_CARE_PROVIDER_SITE_OTHER): Payer: Managed Care, Other (non HMO)

## 2022-04-07 DIAGNOSIS — R400 Somnolence: Secondary | ICD-10-CM | POA: Diagnosis not present

## 2022-04-07 NOTE — Progress Notes (Unsigned)
EEG complete - results pending 

## 2022-04-08 NOTE — Telephone Encounter (Signed)
Please let patient know that EEG is normal.  I would like to refer patient to sleep medicine for recurrent episodes of excessive somnolence

## 2022-04-08 NOTE — Progress Notes (Signed)
ELECTROENCEPHALOGRAM REPORT  Date of Study: 04/07/2022  Patient's Name: Amy Yang MRN: [id number] Date of Birth: 03-24-1975  Clinical History: 47 year old female with migraines who presents for recurrent episodes of acute excessive somnolence and paresthesias.  Medications: Topiramate ER Ubrelvy Zofran  Technical Summary: A multichannel digital EEG recording measured by the international 10-20 system with electrodes applied with paste and impedances below 5000 ohms performed in our laboratory with EKG monitoring in an awake and asleep patient.  Hyperventilation and photic stimulation were performed.  The digital EEG was referentially recorded, reformatted, and digitally filtered in a variety of bipolar and referential montages for optimal display.    Description: The patient is awake and asleep during the recording.  During maximal wakefulness, there is a symmetric, medium voltage 10 Hz posterior dominant rhythm that attenuates with eye opening.  The record is symmetric.  During drowsiness and sleep, there is an increase in theta slowing of the background.  Stage 2 sleep was seen.  Hyperventilation and photic stimulation did not elicit any abnormalities.  There were no epileptiform discharges or electrographic seizures seen.    EKG lead was unremarkable.  Impression: This awake and asleep EEG is normal.    Clinical Correlation: A normal EEG does not exclude a clinical diagnosis of epilepsy.  If further clinical questions remain, prolonged EEG may be helpful.  Clinical correlation is advised.   Metta Clines, DO

## 2022-04-12 NOTE — Addendum Note (Signed)
Addended by: Venetia Night on: 04/12/2022 10:39 AM   Modules accepted: Orders

## 2022-04-13 ENCOUNTER — Telehealth: Payer: Self-pay

## 2022-04-13 DIAGNOSIS — G43709 Chronic migraine without aura, not intractable, without status migrainosus: Secondary | ICD-10-CM

## 2022-04-13 NOTE — Telephone Encounter (Signed)
PA request received via CMM/providers office for Botox 200UNIT solution  PA has been submitted to [facility] and is pending determination.   Key: [id number]

## 2022-04-13 NOTE — Telephone Encounter (Signed)
PA has been submitted and will be updated in additional encounter created.

## 2022-04-25 ENCOUNTER — Other Ambulatory Visit: Payer: Self-pay | Admitting: Neurology

## 2022-04-28 ENCOUNTER — Ambulatory Visit: Payer: Managed Care, Other (non HMO) | Admitting: Neurology

## 2022-04-29 ENCOUNTER — Ambulatory Visit (INDEPENDENT_AMBULATORY_CARE_PROVIDER_SITE_OTHER): Payer: Managed Care, Other (non HMO) | Admitting: Nurse Practitioner

## 2022-04-29 ENCOUNTER — Encounter: Payer: Self-pay | Admitting: Nurse Practitioner

## 2022-04-29 ENCOUNTER — Ambulatory Visit: Payer: Managed Care, Other (non HMO) | Admitting: Neurology

## 2022-04-29 VITALS — BP 130/78 | HR 70 | Ht 68.0 in | Wt 140.8 lb

## 2022-04-29 DIAGNOSIS — M6281 Muscle weakness (generalized): Secondary | ICD-10-CM

## 2022-04-29 DIAGNOSIS — H9313 Tinnitus, bilateral: Secondary | ICD-10-CM

## 2022-04-29 DIAGNOSIS — G4719 Other hypersomnia: Secondary | ICD-10-CM | POA: Diagnosis not present

## 2022-04-29 DIAGNOSIS — R682 Dry mouth, unspecified: Secondary | ICD-10-CM

## 2022-04-29 DIAGNOSIS — R42 Dizziness and giddiness: Secondary | ICD-10-CM

## 2022-04-29 DIAGNOSIS — R5383 Other fatigue: Secondary | ICD-10-CM

## 2022-04-29 DIAGNOSIS — H539 Unspecified visual disturbance: Secondary | ICD-10-CM

## 2022-04-29 DIAGNOSIS — G4710 Hypersomnia, unspecified: Secondary | ICD-10-CM

## 2022-04-29 NOTE — Patient Instructions (Signed)
Attend sleep study followed by [facility]. Someone will contact you for scheduling   I will reach out to Dr. Tomi Likens to discuss further workup with your current symptoms.  Follow up after sleep studies with Dr. Ander Slade (new pt 30 min slot) or Katie Almond Fitzgibbon,NP, or sooner, if needed

## 2022-04-29 NOTE — Progress Notes (Signed)
$'@Patient'z$  ID: Amy Yang, female    DOB: May 04, 1975, 47 y.o.   MRN: [id number]  Chief Complaint  Patient presents with   Consult    Pt consult states that she has been having episodes of extreme tiredness and passing out since Dec.17th.     Referring provider: Pieter Partridge, DO  HPI: 47 year old female, never smoker referred for sleep consult. Past medical history significant for chronic migraines, chronic bronchitis.  TEST/EVENTS:   04/29/2022: Today - sleep consult Patient presents today for sleep consult, referred by Dr. Tomi Likens.  In December, she became sick with viral type illness and was treated for bronchitis with course of doxycycline and benzonatate.  Shortly after this, she started having significant daytime fatigue symptoms.  She would feel okay when she initially woke up but then she would have severe hypersomnia episodes throughout the day.  She felt like she could barely keep her eyes open.  She was evaluated by her primary care doctor with normal CBC and TSH.  The symptoms have progressed since then and slowly gotten worse.  She was referred to neurology because she started having weakness in her legs, ear ringing which started in January and vision changes.  Initially, all of the symptoms would happen when she would become very tired.  Now it seems that the vision changes and ear ringing are constant.  The weakness in her legs comes and goes.  She has had a few episodes where she has blacked out driving and 1 time brushing her son's teeth.  She does not recall anything during those time periods.  She then had a syncopal type episode this morning.  She collapsed and remembers doing so but does not exactly member if she lost consciousness or not.  She does have some associated dizziness and palpitations.  She has had trouble with dry mouth, going on 1 year now.  This was the only symptom that started before the episode of bronchitis.  She has developed some difficulties swallowing.  Appetite is ok otherwise. No other GI symptoms. She does feel like she has trouble holding her bladder at times. The muscles just feel weaker to her. She did not have this before. All of her upper respiratory symptoms resolved within a week of onset.  She denies any joint pains or swelling, rashes, skin lesions, fevers, night sweats, chills.  She really does not have any trouble sleeping at night.  She was previously using melatonin or Benadryl to help with sleep onset but was never doing this on a consistent basis.  She also was drinking tart cherry juice.  She has stopped all of these since these new symptoms started.  She denies morning headaches, sleep parasomnia/paralysis, snoring, witnessed apneas.  She never experienced any of the symptoms prior to December.  Episodes of weakness are not associated with any mood changes. She had workup with neurology.  Vitamin B and D were all normal.  She did have testing for Lyme, which was negative.  She had an MRI of the brain which showed a few punctate foci of T2 hyperintensity within the white matter of the bilateral frontal lobes.  She also had mild diffuse decrease of the T1 signal of the visualized upper cervical spine.  She was told by neurology that there were no significant findings.  The report in epic does include differential diagnoses including migraine, demyelinating disease and postinflammatory/infectious processes. She goes between 1030 to 11:15 PM.  She falls asleep within 30 minutes.  Sometimes she does not wake at all during the night other times she wakes up to 3 times a night.  Usually gets up around 6:30 AM on the weekdays and 730 to 8 AM on the weekends. No longer taking any sleep aids. Weight stable over the last two years. No former sleep studies. She does have a history of chronic migraine headaches.  She has cut out soy and simple sugars, which seems to have helped.  She does have trouble with recurrent bronchitis.  She has never been on a  maintenance inhaler.  She was never diagnosed formally with asthma.  No history of childhood asthma.  No other known pulmonary diseases. Never had autoimmune workup. She is a never smoker. Does not drink alcohol. No illicit drug use. She lives with her spouse and 2 kids, 63 and 4. They have an au pair. They have one dog. No other animals. No bird exposures. She works as an Chief Strategy Officer. She does travel frequently. She went to [place] in June and [place] in July. No international travel since.  Family history of heart disease (father with MI), mother with stroke, paternal grandmother with cancer and maternal grandfather with cancer.   Allergies  Allergen Reactions   Other Anaphylaxis   Latex    Morphine And Related Nausea And Vomiting   Pineapple    Rhinocort [Budesonide]    Symbicort [Budesonide-Formoterol Fumarate] Swelling    There is no immunization history for the selected administration types on file for this patient.  Past Medical History:  Diagnosis Date   Bronchitis    Encounter for infertility    Kidney stones    Kidney stones    Migraine    Pneumonia    Preterm labor     Tobacco History: Social History   Tobacco Use  Smoking Status Never  Smokeless Tobacco Never   Counseling given: Not Answered   Outpatient Medications Prior to Visit  Medication Sig Dispense Refill   botulinum toxin Type A (BOTOX) 200 units injection INJECT 155 UNITS INTO THE MUSCLES OF MULTIPLE SITES OF THE HEAD, NECK AND FACE EVERY 90 DAYS (DISCARD UNUSED PORTION) 1 each 4   Celecoxib (ELYXYB) 120 MG/4.8ML SOLN Take 120 mg by mouth daily as needed (Maximum 1 in 24 hours). 28.8 mL 5   Galcanezumab-gnlm (EMGALITY) 120 MG/ML SOAJ Inject 120 mg into the skin every 28 (twenty-eight) days. 1.12 mL 11   ibuprofen (ADVIL,MOTRIN) 600 MG tablet Take 1 tablet (600 mg total) by mouth every 6 (six) hours. 30 tablet 0   ondansetron (ZOFRAN ODT) 4 MG disintegrating tablet Take 1 tablet (4 mg total)  by mouth every 8 (eight) hours as needed for nausea or vomiting. 15 tablet 0   tiZANidine (ZANAFLEX) 4 MG capsule Take 4 mg by mouth 3 (three) times daily.     Topiramate ER (TROKENDI XR) 100 MG CP24 TAKE 1 CAPSULE BY MOUTH EVERYDAY AT BEDTIME 90 capsule 1   Ubrogepant (UBRELVY) 100 MG TABS Take 1 tablet by mouth as needed (May repeat dose in 2 hours.  Maximum 2 tablets in 24 hours). 10 tablet 11   valACYclovir (VALTREX) 1000 MG tablet Take 1,000 mg by mouth 2 (two) times daily as needed.     No facility-administered medications prior to visit.     Review of Systems: Pertinent positives listed in HPI  Constitutional: No weight loss or gain, night sweats, fevers, chills HEENT: No sore throat, sneezing, itching, nasal congestion, or post nasal drip. +ear ringing/pressure CV:  +  palpitations, dizziness, syncope. No chest pain, orthopnea, PND, swelling in lower extremities, anasarca Resp: No shortness of breath with exertion or at rest. No excess mucus or change in color of mucus. No productive or non-productive. No hemoptysis. No wheezing.  No chest wall deformity GI:  No heartburn, indigestion, abdominal pain, nausea, vomiting, diarrhea, change in bowel habits, loss of appetite, bloody stools.  GU: No dysuria, change in color of urine, urgency or frequency.  No flank pain, no hematuria  Skin: No rash, lesions, ulcerations MSK:  No joint pain or swelling.  No decreased range of motion.  No back pain. Neuro: No memory impairment  Psych: No depression. +anxious (due to current health state). Mood stable.     Physical Exam:  BP 130/78   Pulse 70   Ht '5\' 8"'$  (1.727 m)   Wt 140 lb 12.8 oz (63.9 kg)   SpO2 100%   BMI 21.41 kg/m   GEN: Pleasant, interactive, well-appearing; in no acute distress HEENT:  Normocephalic and atraumatic. PERRLA. Sclera white. Nasal turbinates pink, moist and patent bilaterally. No rhinorrhea present. Oropharynx pink and moist, without exudate or edema. No lesions,  ulcerations, or postnasal drip.  NECK:  Supple w/ fair ROM. No JVD present. Normal carotid impulses w/o bruits. Thyroid symmetrical with no goiter or nodules palpated. No lymphadenopathy.   CV: RRR, no m/r/g, no peripheral edema. Pulses intact, +2 bilaterally. No cyanosis, pallor or clubbing. PULMONARY:  Unlabored, regular breathing. Clear bilaterally A&P w/o wheezes/rales/rhonchi. No accessory muscle use.  GI: BS present and normoactive. Soft, non-tender to palpation. No organomegaly or masses detected.  MSK: No erythema, warmth or tenderness. Cap refil <2 sec all extrem. No deformities or joint swelling noted.  Neuro: A/Ox3. No focal deficits noted.   Skin: Warm, no lesions or rashe Psych: Normal affect and behavior. Judgement and thought content appropriate.     Lab Results:  CBC    Component Value Date/Time   WBC 4.1 04/30/2022 0909   RBC 4.09 04/30/2022 0909   HGB 13.4 04/30/2022 0909   HCT 39.2 04/30/2022 0909   PLT 249.0 04/30/2022 0909   MCV 95.7 04/30/2022 0909   MCV 92.3 03/09/2016 1054   MCH 32.5 07/05/2020 2329   MCHC 34.3 04/30/2022 0909   RDW 12.3 04/30/2022 0909   LYMPHSABS 1.0 04/30/2022 0909   MONOABS 0.3 04/30/2022 0909   EOSABS 0.0 04/30/2022 0909   BASOSABS 0.0 04/30/2022 0909    BMET    Component Value Date/Time   NA 138 04/30/2022 0909   K 3.7 04/30/2022 0909   CL 106 04/30/2022 0909   CO2 25 04/30/2022 0909   GLUCOSE 93 04/30/2022 0909   BUN 19 04/30/2022 0909   CREATININE 1.17 04/30/2022 0909   CALCIUM 9.7 04/30/2022 0909   GFRNONAA >60 07/05/2020 2329   GFRAA >60 09/18/2019 1635    BNP No results found for: "BNP"   Imaging:  No results found.       Latest Ref Rng & Units 11/08/2019   10:22 AM  PFT Results  FVC-Pre L 4.01   FVC-Predicted Pre % 99   FVC-Post L 4.10   FVC-Predicted Post % 101   Pre FEV1/FVC % % 79   Post FEV1/FCV % % 80   FEV1-Pre L 3.18   FEV1-Predicted Pre % 97   FEV1-Post L 3.29   DLCO uncorrected  ml/min/mmHg 25.54   DLCO UNC% % 106   DLVA Predicted % 113   TLC L 5.80   TLC %  Predicted % 105   RV % Predicted % 110     No results found for: "NITRICOXIDE"      Assessment & Plan:   Hypersomnolence Post viral onset. Unclear underlying etiology at this point. She is having extensive workup with neurology. Her onset is not typical of narcolepsy. Narcolepsy onset is usually in early adulthood. No traumatic event to correlate to. Her muscle weakness is not necessarily cataplectic in nature. However, given lack of answers thus far, I think it is appropriate to have her complete MSLT for further evaluation. We did discuss that even if there is a formal diagnosis of narcolepsy, I would still recommend she continue workup to identify underlying cause of her symptoms. I am concerned for autoimmune disease process with CTD exacerbated by viral illness- labs for further evaluation ordered today. May consider referral to rheumatology based on these.  She also has demyelinating changes on MRI and is appropriate age for onset of MS? Initially concerned about GBS, however, timeline would not be consistent with acute disease. Possibly subacute inflammatory demyelinating polyradiculoneuropathy or chronic? Will discuss with neurology for further evaluation should her autoimmune panel be unrevealing.  Recommend avoiding operating heavy machinery until symptoms improve.   Patient Instructions  Attend sleep study followed by MSLT. Someone will contact you for scheduling   I will reach out to Dr. Tomi Likens to discuss further workup with your current symptoms.  Follow up after sleep studies with Dr. Ander Slade (new pt 30 min slot) or Katie Jahnavi Muratore,NP, or sooner, if needed    Fatigue Possibly postviral; although, not as likely given her constellation of symptoms. See above plan.  I spent 60 minutes of dedicated to the care of this patient on the date of this encounter to include pre-visit review of records,  face-to-face time with the patient discussing conditions above, post visit ordering of testing, clinical documentation with the electronic health record, making appropriate referrals as documented, and communicating necessary findings to members of the patients care team.  Clayton Bibles, NP 04/30/2022  Pt aware and understands NP's role.

## 2022-04-30 ENCOUNTER — Other Ambulatory Visit (INDEPENDENT_AMBULATORY_CARE_PROVIDER_SITE_OTHER): Payer: Managed Care, Other (non HMO)

## 2022-04-30 ENCOUNTER — Encounter: Payer: Self-pay | Admitting: Nurse Practitioner

## 2022-04-30 DIAGNOSIS — R42 Dizziness and giddiness: Secondary | ICD-10-CM | POA: Diagnosis not present

## 2022-04-30 DIAGNOSIS — H9313 Tinnitus, bilateral: Secondary | ICD-10-CM | POA: Diagnosis not present

## 2022-04-30 DIAGNOSIS — R5383 Other fatigue: Secondary | ICD-10-CM | POA: Insufficient documentation

## 2022-04-30 DIAGNOSIS — G4710 Hypersomnia, unspecified: Secondary | ICD-10-CM | POA: Insufficient documentation

## 2022-04-30 DIAGNOSIS — M6281 Muscle weakness (generalized): Secondary | ICD-10-CM

## 2022-04-30 DIAGNOSIS — G4719 Other hypersomnia: Secondary | ICD-10-CM | POA: Diagnosis not present

## 2022-04-30 DIAGNOSIS — R682 Dry mouth, unspecified: Secondary | ICD-10-CM

## 2022-04-30 DIAGNOSIS — H539 Unspecified visual disturbance: Secondary | ICD-10-CM

## 2022-04-30 LAB — CBC WITH DIFFERENTIAL/PLATELET
Basophils Absolute: 0 10*3/uL (ref 0.0–0.1)
Basophils Relative: 0.8 % (ref 0.0–3.0)
Eosinophils Absolute: 0 10*3/uL (ref 0.0–0.7)
Eosinophils Relative: 1.2 % (ref 0.0–5.0)
HCT: 39.2 % (ref 36.0–46.0)
Hemoglobin: 13.4 g/dL (ref 12.0–15.0)
Lymphocytes Relative: 25.3 % (ref 12.0–46.0)
Lymphs Abs: 1 10*3/uL (ref 0.7–4.0)
MCHC: 34.3 g/dL (ref 30.0–36.0)
MCV: 95.7 fl (ref 78.0–100.0)
Monocytes Absolute: 0.3 10*3/uL (ref 0.1–1.0)
Monocytes Relative: 8 % (ref 3.0–12.0)
Neutro Abs: 2.7 10*3/uL (ref 1.4–7.7)
Neutrophils Relative %: 64.7 % (ref 43.0–77.0)
Platelets: 249 10*3/uL (ref 150.0–400.0)
RBC: 4.09 Mil/uL (ref 3.87–5.11)
RDW: 12.3 % (ref 11.5–15.5)
WBC: 4.1 10*3/uL (ref 4.0–10.5)

## 2022-04-30 LAB — COMPREHENSIVE METABOLIC PANEL
ALT: 20 U/L (ref 0–35)
AST: 20 U/L (ref 0–37)
Albumin: 4.6 g/dL (ref 3.5–5.2)
Alkaline Phosphatase: 53 U/L (ref 39–117)
BUN: 19 mg/dL (ref 6–23)
CO2: 25 mEq/L (ref 19–32)
Calcium: 9.7 mg/dL (ref 8.4–10.5)
Chloride: 106 mEq/L (ref 96–112)
Creatinine, Ser: 1.17 mg/dL (ref 0.40–1.20)
GFR: 56.75 mL/min — ABNORMAL LOW (ref >60.00)
Glucose, Bld: 93 mg/dL (ref 70–99)
Potassium: 3.7 mEq/L (ref 3.5–5.1)
Sodium: 138 mEq/L (ref 135–145)
Total Bilirubin: 0.6 mg/dL (ref 0.2–1.2)
Total Protein: 7.2 g/dL (ref 6.0–8.3)

## 2022-04-30 LAB — SEDIMENTATION RATE: Sed Rate: 5 mm/hr (ref 0–20)

## 2022-04-30 NOTE — Assessment & Plan Note (Signed)
Possibly postviral; although, not as likely given her constellation of symptoms. See above plan.

## 2022-04-30 NOTE — Assessment & Plan Note (Addendum)
Post viral onset. Unclear underlying etiology at this point. She is having extensive workup with neurology. Her onset is not typical of narcolepsy. Narcolepsy onset is usually in early adulthood. No traumatic event to correlate to. Her muscle weakness is not necessarily cataplectic in nature. However, given lack of answers thus far, I think it is appropriate to have her complete MSLT for further evaluation. We did discuss that even if there is a formal diagnosis of narcolepsy, I would still recommend she continue workup to identify underlying cause of her symptoms. I am concerned for autoimmune disease process with CTD exacerbated by viral illness- labs for further evaluation ordered today. May consider referral to rheumatology based on these.  She also has demyelinating changes on MRI and is appropriate age for onset of MS? Initially concerned about GBS, however, timeline would not be consistent with acute disease. Possibly subacute inflammatory demyelinating polyradiculoneuropathy or chronic? Will discuss with neurology for further evaluation should her autoimmune panel be unrevealing.  Recommend avoiding operating heavy machinery until symptoms improve.   Patient Instructions  Attend sleep study followed by MSLT. Someone will contact you for scheduling   I will reach out to Dr. Tomi Likens to discuss further workup with your current symptoms.  Follow up after sleep studies with Dr. Ander Slade (new pt 30 min slot) or Katie Ridwan Bondy,NP, or sooner, if needed

## 2022-05-01 LAB — RNP ANTIBODIES: ENA RNP Ab: 0.6 AI (ref 0.0–0.9)

## 2022-05-02 LAB — CK TOTAL AND CKMB (NOT AT ARMC)
CK, MB: 1 ng/mL (ref 0–5.0)
Relative Index: 1.1 (ref 0–4.0)
Total CK: 87 U/L (ref 29–143)

## 2022-05-03 LAB — ALDOLASE: Aldolase: 2.9 U/L (ref <=8.1)

## 2022-05-04 LAB — ANTI-SCLERODERMA ANTIBODY: Scleroderma (Scl-70) (ENA) Antibody, IgG: <1 AI

## 2022-05-04 LAB — ANTI-NUCLEAR AB-TITER (ANA TITER)
ANA TITER: 1:40 {titer} — ABNORMAL HIGH
ANA Titer 1: 1:80 {titer} — ABNORMAL HIGH

## 2022-05-04 LAB — CYCLIC CITRUL PEPTIDE ANTIBODY, IGG: Cyclic Citrullin Peptide Ab: <16 UNITS

## 2022-05-04 LAB — RHEUMATOID FACTOR: Rheumatoid fact SerPl-aCnc: <14 IU/mL (ref <14)

## 2022-05-04 LAB — ANCA SCREEN W REFLEX TITER: ANCA SCREEN: NEGATIVE

## 2022-05-04 LAB — SJOGRENS SYNDROME-B EXTRACTABLE NUCLEAR ANTIBODY: SSB (La) (ENA) Antibody, IgG: <1 AI

## 2022-05-04 LAB — ANA: Anti Nuclear Antibody (ANA): POSITIVE — AB

## 2022-05-04 LAB — SJOGRENS SYNDROME-A EXTRACTABLE NUCLEAR ANTIBODY: SSA (Ro) (ENA) Antibody, IgG: <1 AI

## 2022-05-04 LAB — ANTI-DNA ANTIBODY, DOUBLE-STRANDED: ds DNA Ab: 2 IU/mL

## 2022-05-05 ENCOUNTER — Telehealth: Payer: Self-pay | Admitting: Neurology

## 2022-05-05 ENCOUNTER — Ambulatory Visit: Payer: Managed Care, Other (non HMO) | Admitting: Neurology

## 2022-05-05 DIAGNOSIS — G4710 Hypersomnia, unspecified: Secondary | ICD-10-CM

## 2022-05-05 NOTE — Telephone Encounter (Signed)
Patient wanted to know what the next steps are she should do in the meantime while she waiting on the sleep study to be done.  New symptoms of Nausea. Block out episode.  Patient wanted to know if she could get Zofran refilled for the nausea.   Per Patient she had a shoulder dislocation. She will need a surgery. The provider office will send a clearance to the office.

## 2022-05-05 NOTE — Telephone Encounter (Signed)
Patient wants to speak to someone about her next steps and testing  please call   She left message on VM

## 2022-05-05 NOTE — Telephone Encounter (Signed)
Patient Advocate Encounter  Prior Authorization for Botox 200UNIT solution has been approved.    PA# [id number] Insurance [employer] Electronic PA Form Effective dates: 04/13/2022 through 04/14/2023      Lyndel Safe, [place] Patient Advocate Specialist [place] Patient Advocate Team Direct Number: [phone]  Fax: [phone]

## 2022-05-06 NOTE — Telephone Encounter (Signed)
Per Dr.Jaffe,  I would order a routine EEG.  IF she just suddenly blacked out/lost consciousness, without warning, then she shouldn't be driving  Patient advised.

## 2022-05-10 NOTE — Progress Notes (Signed)
ANA is very low positive. Possibly non-specific and roughly half of people or more have this without any significant autoimmune disease. However, given her symptoms, I am going to place a referral to rheumatology. She should continue to follow up with neurology as well. Still waiting on two of her labs to come back...please call the lab to follow up on the myositis panel and Anti Denice [place]. They should be back by now. Follow up on her MSLT to see why this hasn't been scheduled yet. Thanks.

## 2022-05-12 ENCOUNTER — Telehealth: Payer: Self-pay | Admitting: Nurse Practitioner

## 2022-05-12 NOTE — Telephone Encounter (Signed)
PT calling for [place], Ms. Cobb's nurse. Upset w/hold times.   Pls call @ [phone]   Also, Has not heard on sleep study thru insurance.  She called last week asking about this and had not hears.

## 2022-05-12 NOTE — Telephone Encounter (Signed)
Has sleep study been approved through insurance yet? Can yall tell

## 2022-05-14 ENCOUNTER — Telehealth: Payer: Self-pay | Admitting: Nurse Practitioner

## 2022-05-14 ENCOUNTER — Ambulatory Visit: Payer: Managed Care, Other (non HMO) | Admitting: Neurology

## 2022-05-14 DIAGNOSIS — G43709 Chronic migraine without aura, not intractable, without status migrainosus: Secondary | ICD-10-CM | POA: Diagnosis not present

## 2022-05-14 MED ORDER — ONABOTULINUMTOXINA 100 UNITS IJ SOLR
200.0000 [IU] | Freq: Once | INTRAMUSCULAR | Status: AC
  Administered 2022-05-14: 155 [IU] via INTRAMUSCULAR

## 2022-05-14 NOTE — Progress Notes (Signed)
[facility]  ° °Procedure Note Botox ° °Attending: Dr. Mindy Gali ° °Preoperative Diagnosis(es): Chronic migraine ° °Consent obtained from: The patient °Benefits discussed included, but were not limited to decreased muscle tightness, increased joint range of motion, and decreased pain.  Risk discussed included, but were not limited pain and discomfort, bleeding, bruising, excessive weakness, venous thrombosis, muscle atrophy and dysphagia.  Anticipated outcomes of the procedure as well as he risks and benefits of the alternatives to the procedure, and the roles and tasks of the personnel to be involved, were discussed with the patient, and the patient consents to the procedure and agrees to proceed. A copy of the patient medication guide was given to the patient which explains the blackbox warning. ° °Patients identity and treatment sites confirmed Yes.  . ° °Details of Procedure: °Skin was cleaned with alcohol. Prior to injection, the needle plunger was aspirated to make sure the needle was not within a blood vessel.  There was no blood retrieved on aspiration.   ° °Following is a summary of the muscles injected  And the amount of Botulinum toxin used: ° °Dilution °200 units of Botox was reconstituted with 4 ml of preservative free normal saline. °Time of reconstitution: At the time of the office visit (<30 minutes prior to injection)  ° °Injections  °155 total units of Botox was injected with a 30 gauge needle. ° °Injection Sites: °L occipitalis: 15 units- 3 sites  °R occiptalis: 15 units- 3 sites ° °L upper trapezius: 15 units- 3 sites °R upper trapezius: 15 units- 3 sits          °L paraspinal: 10 units- 2 sites °R paraspinal: 10 units- 2 sites ° °Face °L frontalis(2 injection sites):10 units   °R frontalis(2 injection sites):10 units         °L corrugator: 5 units   °R corrugator: 5 units           °Procerus: 5 units   °L temporalis: 20 units °R temporalis: 20 units  ° °Agent:  °200 units of botulinum Type  A (Onobotulinum Toxin type A) was reconstituted with 4 ml of preservative free normal saline.  °Time of reconstitution: At the time of the office visit (<30 minutes prior to injection)  ° ° ° Total injected (Units):  155 ° Total wasted (Units):  45 ° °Patient tolerated procedure well without complications.   °Reinjection is anticipated in 3 months. ° ° °

## 2022-05-14 NOTE — Telephone Encounter (Signed)
Fax received from Dr. Justice Britain with [facility] to perform a Left shoulder arthroscopy and bankart repair on patient.  Patient needs surgery clearance. Surgery is 07/13/2022. Patient was seen on 04/29/2022. Office protocol is a risk assessment can be sent to surgeon if patient has been seen in 60 days or less.   Sending to Roxan Diesel NP for risk assessment or recommendations if patient needs to be seen in office prior to surgical procedure.

## 2022-05-18 LAB — MYOMARKER 3 PLUS PROFILE (RDL)

## 2022-05-18 LAB — ANTI-JO-1 AB (RDL)

## 2022-05-18 NOTE — Telephone Encounter (Signed)
She doesn't need surgical clearance from our office at this time. She is being evaluated for narcolepsy but has yet to have her sleep studies so unable to make any recommendations until that test is complete; no other pulmonary concerns. They should obtain clearance from neurology. Thanks.

## 2022-05-18 NOTE — Telephone Encounter (Signed)
PCCs, please advise on pts message regarding her sleep study. Thanks.

## 2022-05-18 NOTE — Telephone Encounter (Signed)
OV notes and clearance form have been faxed back to [facility]. Nothing further needed at this time. ?

## 2022-05-19 NOTE — Telephone Encounter (Signed)
This has been scheduled auth received

## 2022-05-21 NOTE — Telephone Encounter (Signed)
Closing encounter

## 2022-05-24 ENCOUNTER — Ambulatory Visit: Payer: Managed Care, Other (non HMO) | Admitting: Neurology

## 2022-05-24 DIAGNOSIS — G4710 Hypersomnia, unspecified: Secondary | ICD-10-CM

## 2022-05-25 NOTE — Progress Notes (Unsigned)
Ambulatory EEG discontinued - no skin breakdown at [facility].

## 2022-05-27 ENCOUNTER — Other Ambulatory Visit: Payer: Managed Care, Other (non HMO)

## 2022-05-27 ENCOUNTER — Telehealth: Payer: Self-pay | Admitting: Neurology

## 2022-05-27 NOTE — Progress Notes (Signed)
ELECTROENCEPHALOGRAM REPORT  Dates of Recording: 05/24/2022 at 16:06 to 05/25/2022 at 15:50  Patient's Name: Amy Yang MRN: [id number] Date of Birth: 1975/12/31  Procedure: 23-hour 44 minute ambulatory EEG  History: 47 year old female with migraines presenting with recurrent episodes of sudden excessive somnolence and black outs.    Medications:  Current Outpatient Medications on File Prior to Visit  Medication Sig Dispense Refill   botulinum toxin Type A (BOTOX) 200 units injection INJECT 155 UNITS INTO THE MUSCLES OF MULTIPLE SITES OF THE HEAD, NECK AND FACE EVERY 90 DAYS (DISCARD UNUSED PORTION) 1 each 4   Celecoxib (ELYXYB) 120 MG/4.8ML SOLN Take 120 mg by mouth daily as needed (Maximum 1 in 24 hours). 28.8 mL 5   Galcanezumab-gnlm (EMGALITY) 120 MG/ML SOAJ Inject 120 mg into the skin every 28 (twenty-eight) days. 1.12 mL 11   ibuprofen (ADVIL,MOTRIN) 600 MG tablet Take 1 tablet (600 mg total) by mouth every 6 (six) hours. 30 tablet 0   ondansetron (ZOFRAN ODT) 4 MG disintegrating tablet Take 1 tablet (4 mg total) by mouth every 8 (eight) hours as needed for nausea or vomiting. 15 tablet 0   tiZANidine (ZANAFLEX) 4 MG capsule Take 4 mg by mouth 3 (three) times daily.     Topiramate ER (TROKENDI XR) 100 MG CP24 TAKE 1 CAPSULE BY MOUTH EVERYDAY AT BEDTIME 90 capsule 1   Ubrogepant (UBRELVY) 100 MG TABS Take 1 tablet by mouth as needed (May repeat dose in 2 hours.  Maximum 2 tablets in 24 hours). 10 tablet 11   valACYclovir (VALTREX) 1000 MG tablet Take 1,000 mg by mouth 2 (two) times daily as needed.     [DISCONTINUED] SUMAtriptan-naproxen (TREXIMET) 85-500 MG tablet Take 1 tablet by mouth every 2 (two) hours as needed for migraine. Maximum 2 tablets in 24 hours.  Hold breastfeeding for 12 hours after use. (Patient not taking: Reported on 02/06/2020) 10 tablet 2   No current facility-administered medications on file prior to visit.    Technical Summary: This is a 24-hour multichannel  digital EEG recording measured by the international 10-20 system with electrodes applied with paste and impedances below 5000 ohms performed as portable with EKG monitoring.  The digital EEG was referentially recorded, reformatted, and digitally filtered in a variety of bipolar and referential montages for optimal display.    DESCRIPTION OF RECORDING: During maximal wakefulness, the background activity consisted of a symmetric 9-10Hz  posterior dominant rhythm which was reactive to eye opening.  There were no epileptiform discharges or focal slowing seen in wakefulness.  During the recording, the patient progresses through wakefulness, drowsiness, and Stage 2 sleep.  Again, there were no epileptiform discharges seen.  Events: 05/25/2022 from 07:50-10:30:  reported onset of extreme fatigue, head tingling, ringing in ears..  There were no electrographic seizures seen.  EKG lead was unremarkable.  IMPRESSION: This 24-hour ambulatory EEG study is normal.      Metta Clines, DO

## 2022-05-27 NOTE — Telephone Encounter (Signed)
Patient advised of results.

## 2022-05-27 NOTE — Telephone Encounter (Signed)
Ambulatory EEG was normal.  I have no further recommendations at this time.

## 2022-06-01 IMAGING — US US ABDOMEN LIMITED RUQ/ASCITES
1 series · 14 of 25 positions shown · non-contrast
Comparison: None.

CLINICAL DATA: Initial evaluation for acute abdominal pain.

EXAM:
ULTRASOUND ABDOMEN LIMITED RIGHT UPPER QUADRANT

[Series 1: us abdomen limited ruq (liver/gb) · 14 of 72 slices shown]
[im 1/72]
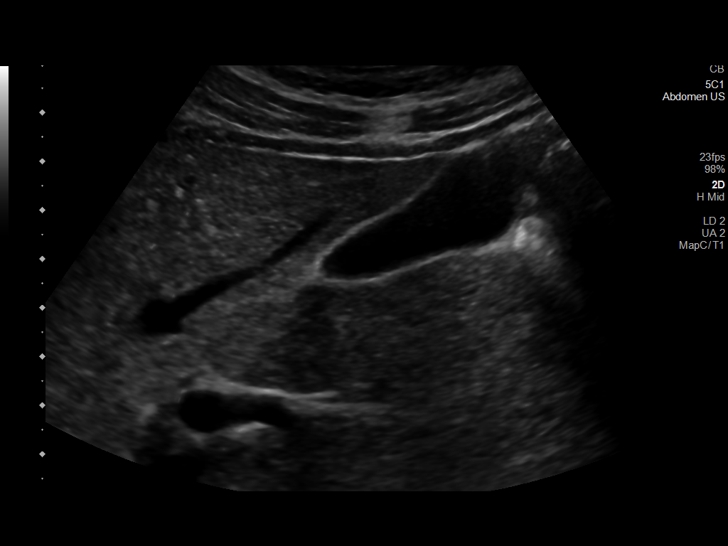
[im 6/72]
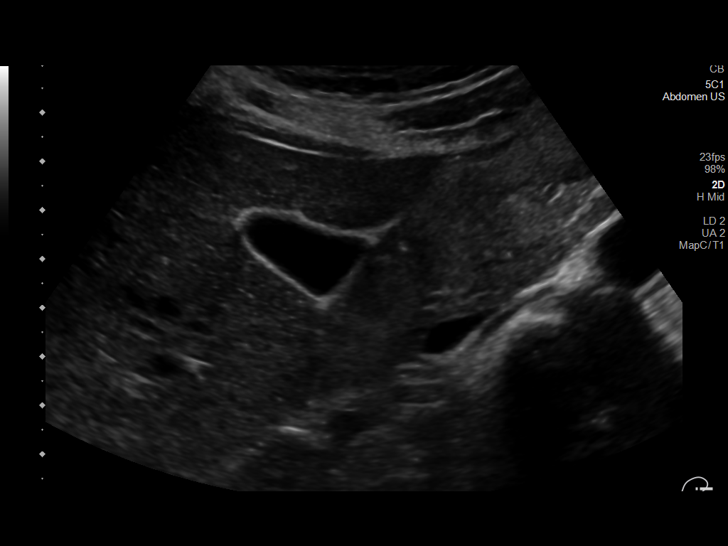
[im 12/72]
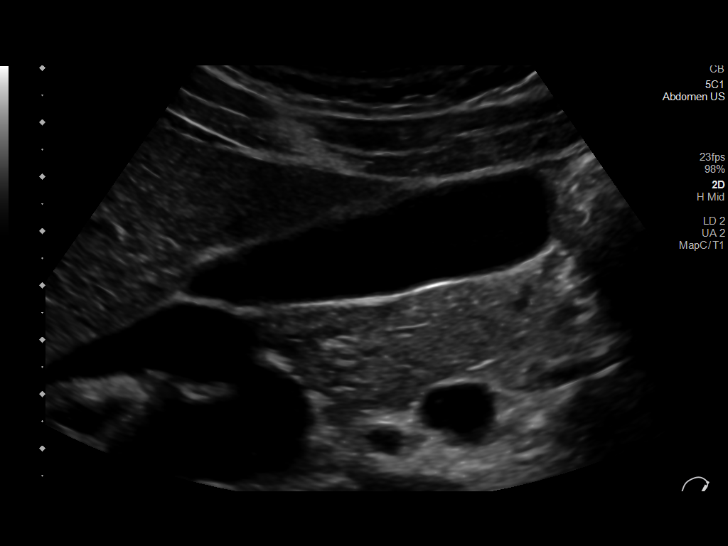
[im 18/72]
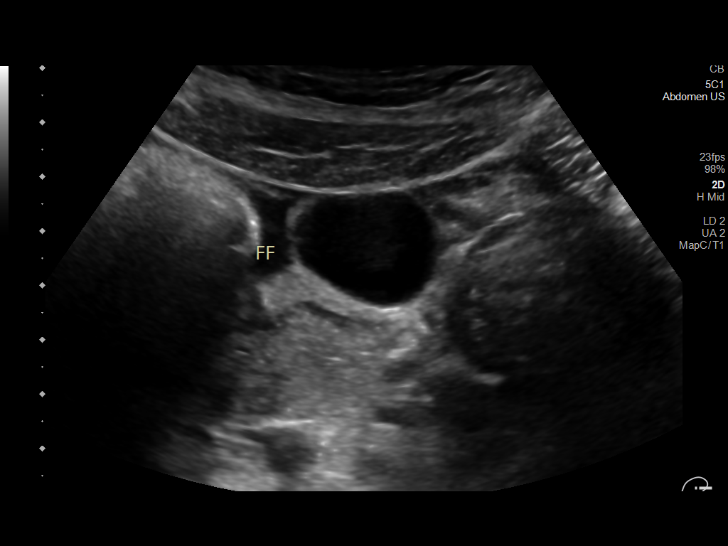
[im 24/72]
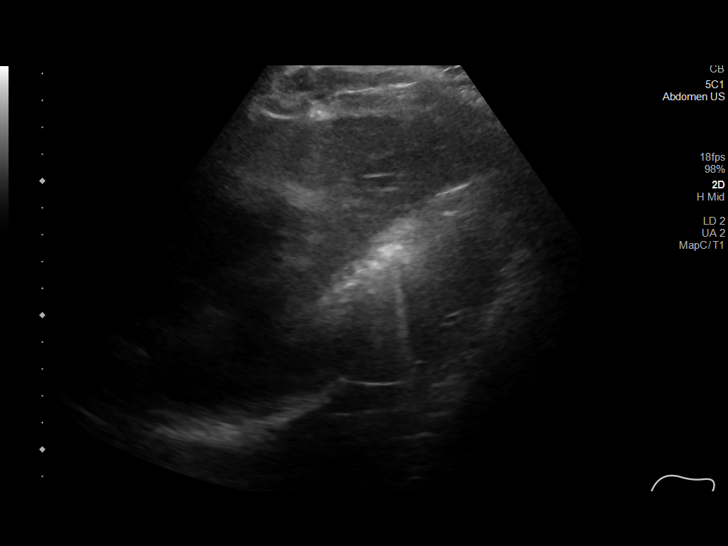
[im 27/72]
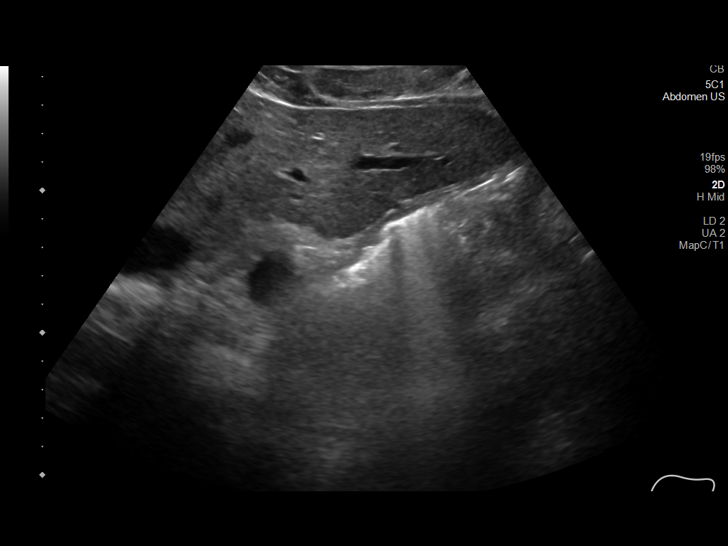
[im 33/72]
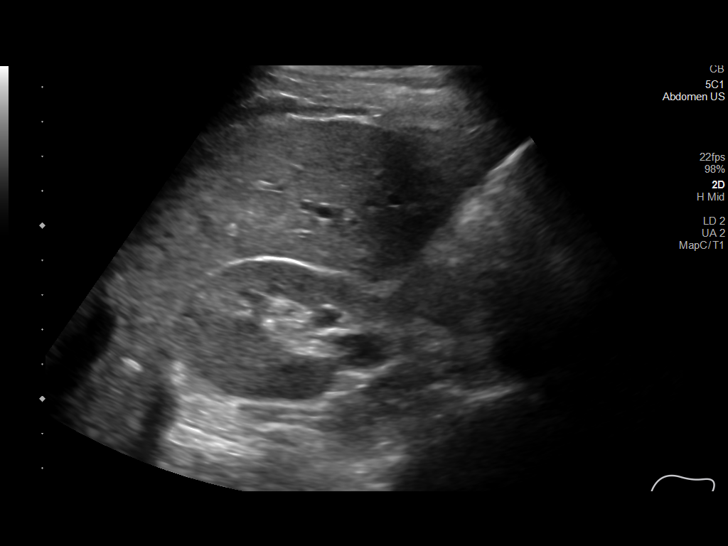
[im 39/72]
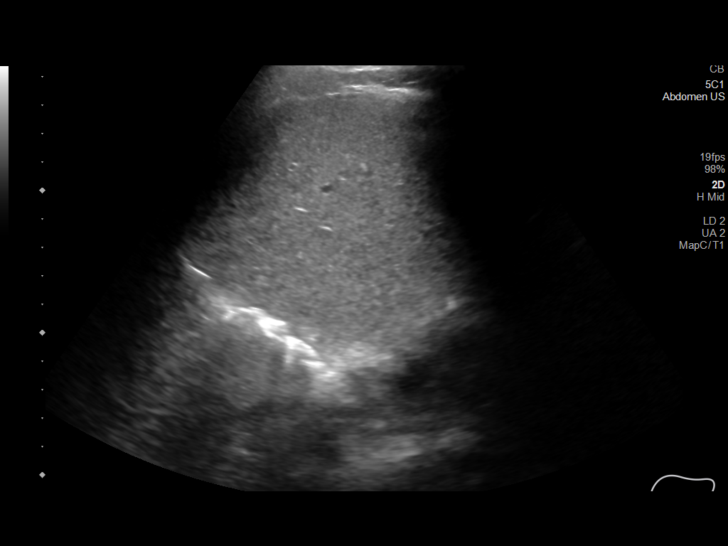
[im 45/72]
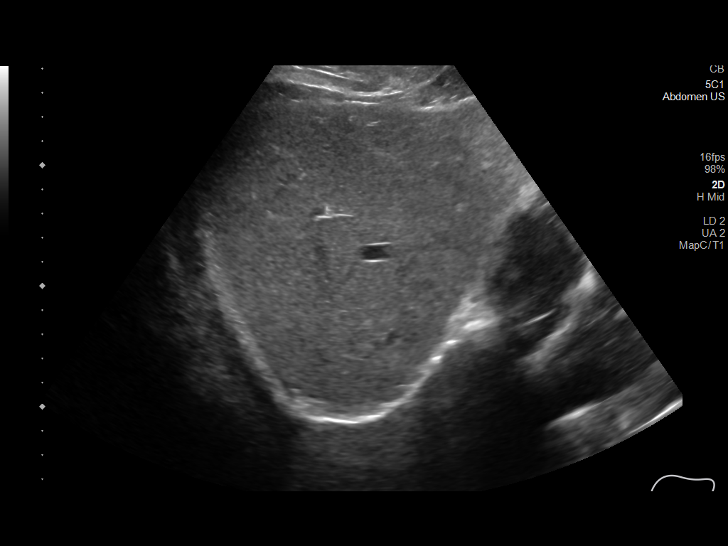
[im 48/72]
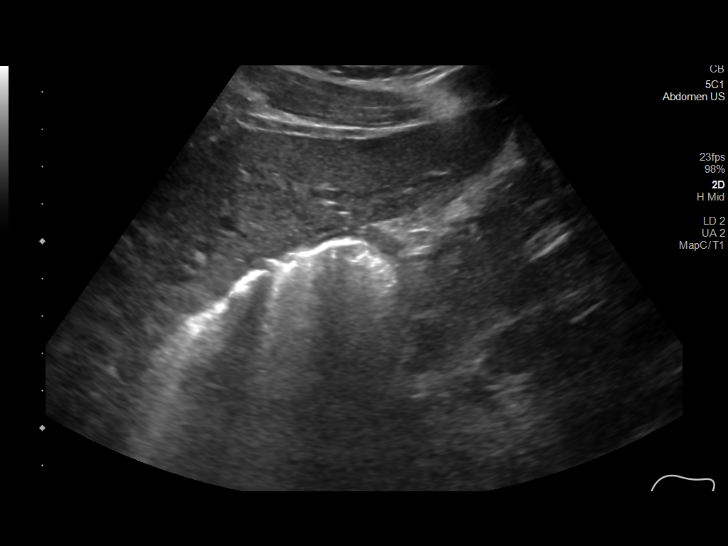
[im 54/72]
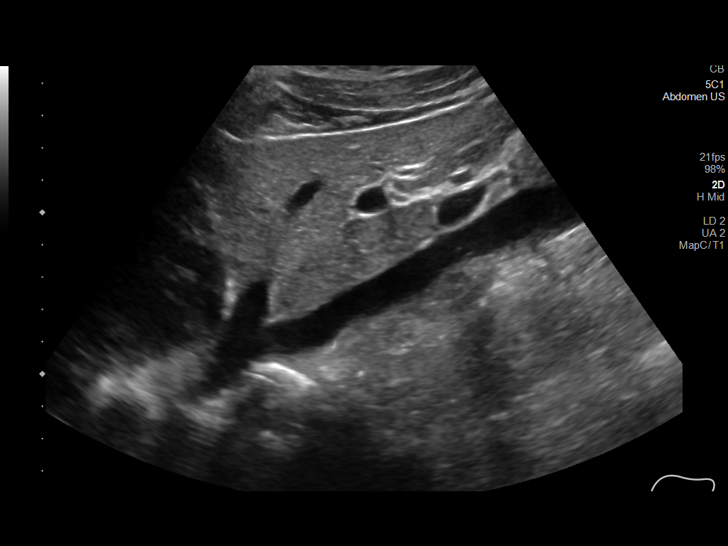
[im 60/72]
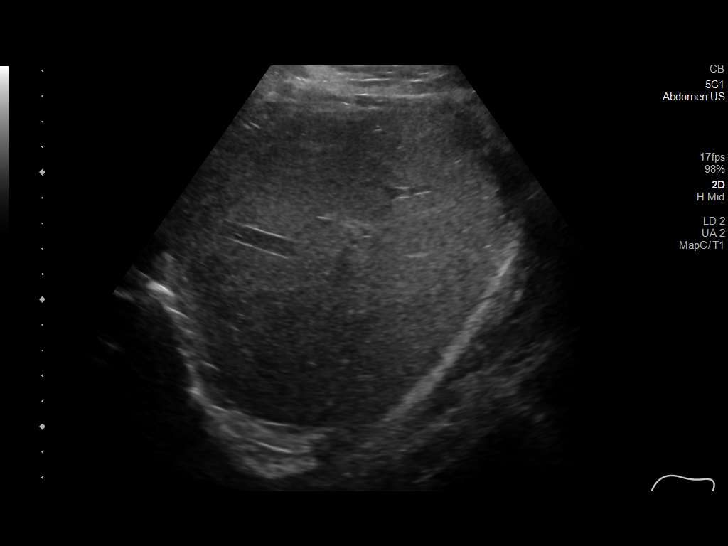
[im 66/72]
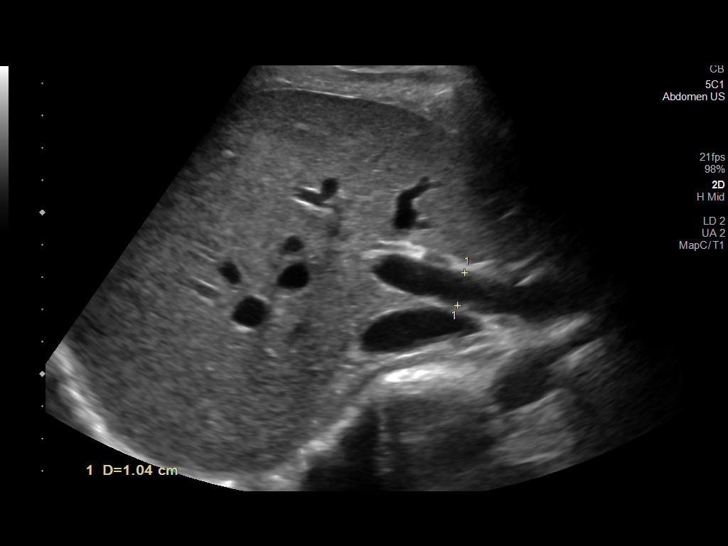
[im 72/72]
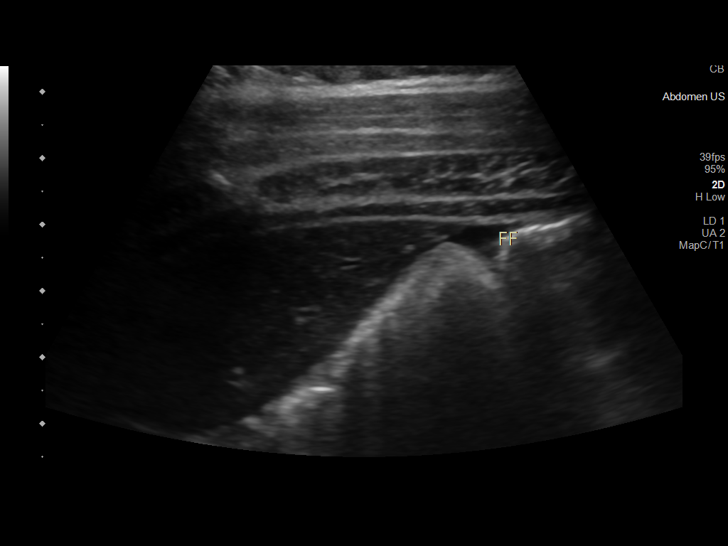

[14 of 25 positions shown; findings below may reference images not displayed]

FINDINGS: Gallbladder:

No gallstones or wall thickening visualized. No sonographic Murphy
sign noted by sonographer. Trace free fluid seen adjacent to the
gallbladder.

Common bile duct:

Diameter: 2.7 mm

Liver:

No focal lesion identified. Within normal limits in parenchymal
echogenicity. Portal vein is patent on color Doppler imaging with
normal direction of blood flow towards the liver. Trace free fluid
seen adjacent to the liver.

Other: None.
IMPRESSION: 1. Trace free fluid adjacent to the liver and gallbladder, of
uncertain significance.
2. Otherwise unremarkable right upper quadrant ultrasound. No
cholelithiasis or biliary dilatation.

## 2022-06-07 ENCOUNTER — Encounter (HOSPITAL_BASED_OUTPATIENT_CLINIC_OR_DEPARTMENT_OTHER): Payer: Managed Care, Other (non HMO) | Admitting: Pulmonary Disease

## 2022-06-07 ENCOUNTER — Ambulatory Visit (HOSPITAL_BASED_OUTPATIENT_CLINIC_OR_DEPARTMENT_OTHER): Payer: Managed Care, Other (non HMO) | Attending: Nurse Practitioner | Admitting: Pulmonary Disease

## 2022-06-07 DIAGNOSIS — G4719 Other hypersomnia: Secondary | ICD-10-CM | POA: Diagnosis present

## 2022-06-08 ENCOUNTER — Ambulatory Visit (HOSPITAL_BASED_OUTPATIENT_CLINIC_OR_DEPARTMENT_OTHER): Payer: Managed Care, Other (non HMO) | Attending: Nurse Practitioner | Admitting: Pulmonary Disease

## 2022-06-08 DIAGNOSIS — G4719 Other hypersomnia: Secondary | ICD-10-CM | POA: Diagnosis not present

## 2022-06-08 NOTE — Procedures (Signed)
      Patient Name: Amy Yang, Amy Yang Date: 06/07/2022 Gender: Female D.O.B: [birth year] Age (years): 46 Referring Provider: Clayton Bibles NP Height (inches): 62 Interpreting Physician: Chesley Mires MD, ABSM Weight (lbs): 138 RPSGT: Gwenyth Allegra BMI: 22 MRN: [id number] Neck Size: 12.50  CLINICAL INFORMATION Sleep Study Type: NPSG  Indication for sleep study: She developed viral illness in December 2023 and has persistent daytime hypersomnia since.  Epworth Sleepiness Score: 24  SLEEP STUDY TECHNIQUE As per the AASM Manual for the Scoring of Sleep and Associated Events v2.3 (April 2016) with a hypopnea requiring 4% desaturations.  The channels recorded and monitored were frontal, central and occipital EEG, electrooculogram (EOG), submentalis EMG (chin), nasal and oral airflow, thoracic and abdominal wall motion, anterior tibialis EMG, snore microphone, electrocardiogram, and pulse oximetry.  MEDICATIONS Medications self-administered by patient taken the night of the study : N/A  SLEEP ARCHITECTURE The study was initiated at 10:23:20 PM and ended at 6:00:12 AM.  Sleep onset time was 26.8 minutes and the sleep efficiency was 82.8%. The total sleep time was 378.5 minutes.  Stage REM latency was 246.0 minutes.  The patient spent 4.1% of the night in stage N1 sleep, 79.3% in stage N2 sleep, 0.0% in stage N3 and 16.6% in REM.  Alpha intrusion was absent.  Supine sleep was 72.13%.  RESPIRATORY PARAMETERS The overall apnea/hypopnea index (AHI) was 0.0 per hour. There were 0 total apneas, including 0 obstructive, 0 central and 0 mixed apneas. There were 0 hypopneas and 0 RERAs.  The AHI during Stage REM sleep was 0.0 per hour.  AHI while supine was 0.0 per hour.  The mean oxygen saturation was 96.9%. The minimum SpO2 during sleep was 94.0%.  snoring was noted during this study.  CARDIAC DATA The 2 lead EKG demonstrated sinus rhythm. The mean heart rate was 50.0  beats per minute. Other EKG findings include: None.  LEG MOVEMENT DATA The total PLMS were 0 with a resulting PLMS index of 0.0. Associated arousal with leg movement index was 0.0 .  IMPRESSIONS - No significant obstructive sleep apnea occurred during this study (AHI = 0.0/h). - The patient had minimal or no oxygen desaturation during the study (Min O2 = 94.0%) - No snoring was audible during this study. - No cardiac abnormalities were noted during this study. - Clinically significant periodic limb movements did not occur during sleep. No significant associated arousals.  DIAGNOSIS - Normal nocturnal polysomnogram.  RECOMMENDATIONS - Avoid alcohol, sedatives and other CNS depressants that may worsen sleep apnea and disrupt normal sleep architecture. - Sleep hygiene should be reviewed to assess factors that may improve sleep quality. - Weight management and regular exercise should be initiated or continued if appropriate. - Please correlate with multiple sleep latency results.  [Electronically signed] 06/08/2022 09:21 AM  Chesley Mires MD, ABSM Diplomate, American Board of Sleep Medicine NPI: [id number]  [place] PH: [phone]   FX: [phone] [place]

## 2022-06-14 DIAGNOSIS — G4719 Other hypersomnia: Secondary | ICD-10-CM

## 2022-06-14 NOTE — Procedures (Signed)
      Patient Name: Amy Yang, Amy Yang Date: 06/08/2022 Gender: Female D.O.B: 1975-04-09 Age (years): 46 Referring Provider: Noemi Chapel NP Height (inches): 67 Interpreting Physician: Coralyn Helling MD, ABSM Weight (lbs): 138 RPSGT: Hitchcock Sink BMI: 22 MRN: [id number] Neck Size: 12.50  CLINICAL INFORMATION Sleep Study Type: MSLT  The patient was referred to the sleep center for evaluation of daytime sleepiness.  Epworth Sleepiness Score: 24  SLEEP STUDY TECHNIQUE A Multiple Sleep Latency Test was performed after an overnight polysomnogram according to the AASM scoring manual v2.3 (April 2016) and clinical guidelines. Five nap opportunities occurred over the course of the test which followed an overnight polysomnogram. The channels recorded and monitored were frontal, central, and occipital electroencephalography (EEG), right and left electrooculogram (EOG), chin electromyography (EMG), and electrocardiogram (EKG).  MEDICATIONS Medications taken by the patient : N/A Medications administered by patient during sleep study : No sleep medicine administered.  IMPRESSIONS - Total number of naps attempted: 5. Total number of naps with sleep attained: 3. The Mean Sleep Latency was 10.85 minutes. There were no sleep-onset REM periods. - The patient does not appear to have significant sleepiness on this test, though this finding may not always correlate with the patient's clinical symptoms.  DIAGNOSIS - Normal study  RECOMMENDATIONS - Return for follow up to evaluate other causes of excessive daytime sleepiness.  [Electronically signed] 06/14/2022 03:52 PM  Coralyn Helling MD, ABSM Diplomate, American Board of Sleep Medicine NPI: [id number]  [facility] PH: [phone]   FX: [phone] ACCREDITED BY THE AMERICAN ACADEMY OF SLEEP MEDICINE

## 2022-06-20 ENCOUNTER — Other Ambulatory Visit: Payer: Self-pay | Admitting: Neurology

## 2022-06-23 ENCOUNTER — Other Ambulatory Visit: Payer: Self-pay | Admitting: Neurology

## 2022-06-23 ENCOUNTER — Telehealth: Payer: Self-pay

## 2022-06-23 MED ORDER — ONDANSETRON 4 MG PO TBDP: 4.0000 mg | ORAL_TABLET | Freq: Three times a day (TID) | ORAL | 5 refills | Status: DC | PRN

## 2022-06-23 NOTE — Progress Notes (Signed)
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  She will continue regimen as laid out by the integrative medicine physician.             - Migraine prevention:  Botox, Trokendi XR 100mg  QHS             - Migraine rescue:  Ubrelvy 100mg , Elyxyb, Zofran             - Limit use of pain relievers to no more than 2 days out of week to prevent risk of rebound or medication-overuse headache.             - Keep headache diary 5.  Follow up 6 months.  Total time spent reviewing chart and face to face time with patient discussing diagnosis and plan:  40 minutes.   Subjective:  Amy Yang is a 47 year old Caucasian woman who follows up for migraines as well as new episodes of extreme somnolence.  She is accompanied by her husband who supplements history.  MRI of brian personally reviewed.   UPDATE: Underwent extensive workup for her recent symptoms.  MRIs, EEGs and labs have been performed.  She was referred to Sleep Medicine where extensive lab testing was performed and underwent MSLT and NPSG.  MRI BRAIN W WO (03/31/2022):  1. No acute intracranial abnormality. 2. Few punctate foci of T2 hyperintensity within the white matter of the bilateral frontal lobes, nonspecific. Differential diagnosis include migraine, demyelinating disease and post inflammatory/infectious processes. 3. Mild diffuse decrease of the T1 signal of the visualized upper cervical spine, may be related to red marrow reconversion. Correlate with CBC. ROUTINE EEG (04/07/2022):  Normal awake and asleep EEG 24-HOUR AMBULATORY EEG (3/18-3/19/2024):  Normal.  Exhibited episode of extreme fatigue, head tingling and ringing in the ears without electrographic correlate. MSLT & NPSG (4/1-06/08/2022):  Normal LABS:  ANA mildly positive with titer 1:40-1:80, B1 17, B12 883, D 38.29, Lyme  negative, ENA RNP ab 0.6, Scl-70 ab negative, , ds-DNA ab negative, SSA/SSB abs negative, ANCA negative, sed rate 5, RF negative, CCP ab negative, , MyoMarker 3 Plus profile negative, CK 87, aldolase 2.9, CBC normal, CMP with mildly reduced GFR 56.75 but otherwise unremarkable.  Her last spell occurred about a week ago.  Same semiology.  90 minutes after waking up in the morning, she began feeling foggy-headed.  Head tingling, dry mouth, blurred vision and extreme fatigue.  However, she continues to have ringing in her ears and leg still sometimes feels wobbly.    She saw an integrated health doctor yesterday who started her on naltrexone to "flush out" inflammation..    Current NSAIDS:  Elyxyb, Advil 600mg  Current analgesics:  none Current triptans:  none Current anti-emetic:  Zofran ODT 4mg  Current muscle relaxants:  none Current anti-anxiolytic:  none Current sleep aide:  none Current Antihypertensive medications:  none Current Antidepressant medications:  none Current Anticonvulsant medications:  Trokendi XR 100mg  at bedtime Current anti-CGRP:  Ubrelvy 100mg  Current Vitamins/Herbal/Supplements:  Magnesium, vitamin B6 Current Antihistamines/Decongestants:  Benadryl Current hormone/birth control:  Mirena, Acutane Other therapy:  Botox, Acupuncture   Caffeine:  Highly sensitive to caffeine and headache trigger.  Alcohol:  no Smoker:  no Diet:  Has been eliminating possible food triggers and slowly reintroducing them Exercise:  routine Depression/anxiety:  okay Sleep hygiene:  ok.  Son  sleeping through night.   HISTORY: In early December 2023, she was treated with antibiotics for bronchitis.  In mid-December, she was sitting in church when she suddenly developed dry mouth and felt somnolent, easily falling asleep.  She had to excuse herself to drink water.  Symptoms lasted an hour.  She had two other similar episodes later in December.  On 1/3, she was driving to the office when she  had another episode.  She developed dry mouth and had trouble staying awake.  She made it to work but does not recall the drive.  They started becoming daily.  They usually will start 40 to 90 minutes after waking up in the morning.  She feels fine when she wakes up but will then suddenly feel somnolent with dry mouth.  She also will have blurred and spotty vision.  She also notes associated vertigo and lightheadedness with sensation that she may pass out.  Sometimes her scalp will tingle.  She feels weak and out of breath. She also endorses ringing in her ears.  Symptoms tend to last 90 minutes.  She once had left arm numbness and another time right leg heaviness, numbness and tingling for 10-15 seconds after the episode resolved.  As these episodes occur in the morning, she moved working out in the afternoons.  She notes weakness and more difficulty using her weights.  She also notes mild joint pain but no myalgias.  There is no associated headache and they are not associated with her migraines.  A couple of times, she has had difficulty sleeping because her heart starting racing. She started taking naps during the day, usually for an hour.  However, on the weekends, she has napped for several hours and she is still able to sleep through the night.  Denies drop attacks or sleep paralysis.  In January, she developed onset of ringing in the ears which have been persistent ever since.     PCP check labs, CBC, CMP and TSH, which were unremarkable.  Migraines: Onset:  Since 5th grade Location:  Either side Quality:  Throbbing/stabbing Initial Intensity:  8/10 Aura:  no Prodrome:  no Associated symptoms:  Nausea, photophobia.  Some blurred vision.  No vomiting.  She has not had any new worse headache of her life, waking up from sleep Initial Duration:  Up to 48 hours Initial Frequency:  6 days a month of severe migraine but has daily headache Initial Frequency of abortive medication: only as  needed. Triggers:  Change in weather, hormonal/menstrual cycle, caffeine, MSG Relieving factors:  Massage, ice, Tylenol, Bendaryl Activity: aggravates headache.  Needs to lay down for severe migraines. She does have daily bi-temporal non-throbbing vice-like headache as well.   Past NSAIDS:  Ibuprofen, naproxen Past analgesics:  Tramadol, Fioricet Past abortive triptans:  Sumatriptan (tablet/NS/[place]), Relpax, Zomig.  Treximet (most effective), rizatriptan Past ergot:  Trudhesa Past anti-emetic:  Reglan Past antihypertensive medications: labetolol (low blood pressure) Past antidepressant medications:  Nortriptyline, venlafaxine Past anticonvulsant medications:  Topiramate  (effective but side effects) Past CGRP inhibitor:  Nurtec (moderate efficacy) Past vitamins/Herbal/Supplements:  no Other past therapies:   trigger point injections, biofeedback  Family history of headache:  No  PAST MEDICAL HISTORY: Past Medical History:  Diagnosis Date   Bronchitis    Encounter for infertility    Kidney stones    Kidney stones    Migraine    Pneumonia    Preterm labor     MEDICATIONS: Current Outpatient Medications on File Prior to  Visit  Medication Sig Dispense Refill   botulinum toxin Type A (BOTOX) 200 units injection INJECT 155 UNITS INTO THE MUSCLES OF MULTIPLE SITES OF THE HEAD, NECK AND FACE EVERY 90 DAYS (DISCARD UNUSED PORTION) 1 each 4   Celecoxib (ELYXYB) 120 MG/4.8ML SOLN Take 120 mg by mouth daily as needed (Maximum 1 in 24 hours). 28.8 mL 5   Galcanezumab-gnlm (EMGALITY) 120 MG/ML SOAJ Inject 120 mg into the skin every 28 (twenty-eight) days. 1.12 mL 11   ibuprofen (ADVIL,MOTRIN) 600 MG tablet Take 1 tablet (600 mg total) by mouth every 6 (six) hours. 30 tablet 0   ondansetron (ZOFRAN ODT) 4 MG disintegrating tablet Take 1 tablet (4 mg total) by mouth every 8 (eight) hours as needed for nausea or vomiting. 15 tablet 0   tiZANidine (ZANAFLEX) 4 MG capsule Take 4 mg by mouth 3  (three) times daily.     Topiramate ER (TROKENDI XR) 100 MG CP24 TAKE 1 CAPSULE BY MOUTH EVERYDAY AT BEDTIME 90 capsule 1   Ubrogepant (UBRELVY) 100 MG TABS Take 1 tablet by mouth as needed (May repeat dose in 2 hours.  Maximum 2 tablets in 24 hours). 10 tablet 11   valACYclovir (VALTREX) 1000 MG tablet Take 1,000 mg by mouth 2 (two) times daily as needed.     [DISCONTINUED] SUMAtriptan-naproxen (TREXIMET) 85-500 MG tablet Take 1 tablet by mouth every 2 (two) hours as needed for migraine. Maximum 2 tablets in 24 hours.  Hold breastfeeding for 12 hours after use. (Patient not taking: Reported on 02/06/2020) 10 tablet 2   No current facility-administered medications on file prior to visit.    ALLERGIES: Allergies  Allergen Reactions   Other Anaphylaxis   Latex    Morphine And Related Nausea And Vomiting   Pineapple    Rhinocort [Budesonide]    Symbicort [Budesonide-Formoterol Fumarate] Swelling    FAMILY HISTORY: Family History  Problem Relation Age of Onset   Heart disease Father    Asthma Sister    Cancer Maternal Grandmother    Cancer Maternal Grandfather    Cancer Paternal Grandmother    Diabetes Paternal Grandmother    Stroke Paternal Grandfather       Objective:  Blood pressure 135/86, pulse 72, height  (1.702 m), weight 139 lb (63 kg), SpO2 99 %, unknown if currently breastfeeding. General: No acute distress.  Patient appears well-groomed.     Shon Millet, DO  CC: Ardean Larsen, MD

## 2022-06-23 NOTE — Telephone Encounter (Signed)
MEDICATION: ondansetron (ZOFRAN ODT) 4 MG disintegrating tablet   PHARMACY:  [id number] - [place], [place] - [address] (Ph: [phone]  Comments: Patient is completely out   **Let patient know to contact pharmacy at the end of the day to make sure medication is ready. **  ** Please notify patient to allow 48-72 hours to process**  **Encourage patient to contact the pharmacy for refills or they can request refills through [facility]**

## 2022-06-25 ENCOUNTER — Other Ambulatory Visit: Payer: Self-pay | Admitting: Neurology

## 2022-06-25 ENCOUNTER — Ambulatory Visit: Payer: Managed Care, Other (non HMO) | Admitting: Neurology

## 2022-06-25 ENCOUNTER — Encounter: Payer: Self-pay | Admitting: Neurology

## 2022-06-25 VITALS — BP 135/86 | HR 72 | Ht 67.0 in | Wt 139.0 lb

## 2022-06-25 DIAGNOSIS — G43009 Migraine without aura, not intractable, without status migrainosus: Secondary | ICD-10-CM | POA: Diagnosis not present

## 2022-06-25 DIAGNOSIS — G9331 Postviral fatigue syndrome: Secondary | ICD-10-CM | POA: Diagnosis not present

## 2022-06-25 MED ORDER — ELYXYB 120 MG/4.8ML PO SOLN: 120.0000 mg | Freq: Every day | ORAL | 11 refills | Status: DC | PRN

## 2022-06-25 NOTE — Patient Instructions (Signed)
Follow up for Botox Follow up for routine visit.

## 2022-06-29 ENCOUNTER — Encounter: Payer: Self-pay | Admitting: Neurology

## 2022-07-01 ENCOUNTER — Telehealth: Payer: Self-pay | Admitting: Nurse Practitioner

## 2022-07-02 NOTE — Telephone Encounter (Signed)
Spoke with pt and notified of results per [facility].  Pt verbalized understanding and denied any questions. She states that she is not experiencing any more episodes and will call us as needed. She plans to keep appt with neuro.

## 2022-07-22 ENCOUNTER — Encounter: Payer: Self-pay | Admitting: Neurology

## 2022-07-22 NOTE — Telephone Encounter (Signed)
Message left by [place], [place] needed for Botox. They can not find one for this patient at all.    Please advise

## 2022-07-25 ENCOUNTER — Other Ambulatory Visit (HOSPITAL_COMMUNITY): Payer: Self-pay

## 2022-07-25 NOTE — Telephone Encounter (Signed)
PA is active on file and test billing results with [facility] returns a $0 copay with Copay card. Would you like to send a script to [facility] to stop any further delay in care for patient?

## 2022-07-26 ENCOUNTER — Telehealth: Payer: Self-pay | Admitting: Neurology

## 2022-07-26 NOTE — Telephone Encounter (Signed)
New message   [facility] calling Prior authorization Botox and confirm the dosages of Botox.    Upcoming appt on 08/20/22

## 2022-07-27 ENCOUNTER — Other Ambulatory Visit (HOSPITAL_COMMUNITY): Payer: Self-pay

## 2022-07-27 MED ORDER — BOTOX 200 UNITS IJ SOLR
INTRAMUSCULAR | 4 refills | Status: DC
Start: 2022-07-27 — End: 2023-07-25
  Filled 2022-07-27: qty 1, fill #0
  Filled 2022-07-28 – 2022-07-30 (×2): qty 1, 84d supply, fill #0
  Filled 2022-11-02: qty 1, 84d supply, fill #1
  Filled 2023-02-07: qty 1, 84d supply, fill #2
  Filled 2023-05-02 (×2): qty 1, 84d supply, fill #3

## 2022-07-27 NOTE — Telephone Encounter (Signed)
[employer] calling to follow up on previous message. Please advise, thank you!

## 2022-07-27 NOTE — Addendum Note (Signed)
Addended by: Leida Lauth on: 07/27/2022 03:59 PM   Modules accepted: Orders

## 2022-07-28 ENCOUNTER — Other Ambulatory Visit: Payer: Self-pay

## 2022-07-30 ENCOUNTER — Other Ambulatory Visit (HOSPITAL_COMMUNITY): Payer: Self-pay

## 2022-07-30 ENCOUNTER — Other Ambulatory Visit: Payer: Self-pay

## 2022-08-03 ENCOUNTER — Other Ambulatory Visit (HOSPITAL_COMMUNITY): Payer: Self-pay

## 2022-08-03 ENCOUNTER — Telehealth: Payer: Self-pay

## 2022-08-03 NOTE — Telephone Encounter (Signed)
PA submitted, will be updated in additional encounter created.  

## 2022-08-03 NOTE — Telephone Encounter (Signed)
*  [facility]

## 2022-08-03 NOTE — Telephone Encounter (Signed)
PA request received via provider for Botox 200UNIT solution  PA submitted to [employer] via CMM and is pending additional questions/determination  Key: [id number]

## 2022-08-07 ENCOUNTER — Other Ambulatory Visit (HOSPITAL_COMMUNITY): Payer: Self-pay

## 2022-08-07 NOTE — Telephone Encounter (Signed)
Patient Advocate Encounter  Prior Authorization for BOTOX 200 has been approved with [employer].    PA# --- Effective dates: --- through 2.6.25  Per [facility] test claim, copay for 90 days supply is $0

## 2022-08-12 ENCOUNTER — Other Ambulatory Visit (HOSPITAL_COMMUNITY): Payer: Self-pay

## 2022-08-13 ENCOUNTER — Other Ambulatory Visit (HOSPITAL_COMMUNITY): Payer: Self-pay

## 2022-08-20 ENCOUNTER — Telehealth: Payer: Self-pay | Admitting: Neurology

## 2022-08-20 ENCOUNTER — Ambulatory Visit (INDEPENDENT_AMBULATORY_CARE_PROVIDER_SITE_OTHER): Payer: Managed Care, Other (non HMO) | Admitting: Neurology

## 2022-08-20 DIAGNOSIS — G43709 Chronic migraine without aura, not intractable, without status migrainosus: Secondary | ICD-10-CM

## 2022-08-20 MED ORDER — CELECOXIB 50 MG PO CAPS: 50.0000 mg | ORAL_CAPSULE | Freq: Two times a day (BID) | ORAL | 0 refills | Status: DC | PRN

## 2022-08-20 MED ORDER — ONABOTULINUMTOXINA 100 UNITS IJ SOLR
200.0000 [IU] | Freq: Once | INTRAMUSCULAR | Status: AC
Start: 2022-08-20 — End: 2022-08-20
  Administered 2022-08-20: 155 [IU] via INTRAMUSCULAR

## 2022-08-20 NOTE — Progress Notes (Signed)
Botulinum Clinic  ° °Procedure Note Botox ° °Attending: Dr. Ridley Schewe ° °Preoperative Diagnosis(es): Chronic migraine ° °Consent obtained from: The patient °Benefits discussed included, but were not limited to decreased muscle tightness, increased joint range of motion, and decreased pain.  Risk discussed included, but were not limited pain and discomfort, bleeding, bruising, excessive weakness, venous thrombosis, muscle atrophy and dysphagia.  Anticipated outcomes of the procedure as well as he risks and benefits of the alternatives to the procedure, and the roles and tasks of the personnel to be involved, were discussed with the patient, and the patient consents to the procedure and agrees to proceed. A copy of the patient medication guide was given to the patient which explains the blackbox warning. ° °Patients identity and treatment sites confirmed Yes.  . ° °Details of Procedure: °Skin was cleaned with alcohol. Prior to injection, the needle plunger was aspirated to make sure the needle was not within a blood vessel.  There was no blood retrieved on aspiration.   ° °Following is a summary of the muscles injected  And the amount of Botulinum toxin used: ° °Dilution °200 units of Botox was reconstituted with 4 ml of preservative free normal saline. °Time of reconstitution: At the time of the office visit (<30 minutes prior to injection)  ° °Injections  °155 total units of Botox was injected with a 30 gauge needle. ° °Injection Sites: °L occipitalis: 15 units- 3 sites  °R occiptalis: 15 units- 3 sites ° °L upper trapezius: 15 units- 3 sites °R upper trapezius: 15 units- 3 sits          °L paraspinal: 10 units- 2 sites °R paraspinal: 10 units- 2 sites ° °Face °L frontalis(2 injection sites):10 units   °R frontalis(2 injection sites):10 units         °L corrugator: 5 units   °R corrugator: 5 units           °Procerus: 5 units   °L temporalis: 20 units °R temporalis: 20 units  ° °Agent:  °200 units of botulinum Type  A (Onobotulinum Toxin type A) was reconstituted with 4 ml of preservative free normal saline.  °Time of reconstitution: At the time of the office visit (<30 minutes prior to injection)  ° ° ° Total injected (Units):  155 ° Total wasted (Units):  45 ° °Patient tolerated procedure well without complications.   °Reinjection is anticipated in 3 months. ° ° °

## 2022-08-20 NOTE — Telephone Encounter (Signed)
Insurance would not cover Elyxyb.  It is not formulary.  They recommended Celebrex first.  Will prescribe celecoxib 50mg  tablet as needed for acute migraines.  She has already tried several triptans.  She has already tried over the counter NSAIDs such as ibuprofen and naproxen.  She is unable to take caffeine.  She is currently taking [place] which works, but she needs a second line medication for the occasional times that it does not work.  Elyxyb has been effective.

## 2022-08-22 ENCOUNTER — Other Ambulatory Visit: Payer: Self-pay | Admitting: Neurology

## 2022-09-08 ENCOUNTER — Other Ambulatory Visit: Payer: Self-pay | Admitting: Neurology

## 2022-11-02 ENCOUNTER — Other Ambulatory Visit (HOSPITAL_COMMUNITY): Payer: Self-pay

## 2022-11-02 ENCOUNTER — Other Ambulatory Visit: Payer: Self-pay | Admitting: Neurology

## 2022-11-10 ENCOUNTER — Other Ambulatory Visit (HOSPITAL_COMMUNITY): Payer: Self-pay

## 2022-11-19 ENCOUNTER — Ambulatory Visit: Payer: Managed Care, Other (non HMO) | Admitting: Neurology

## 2022-11-19 DIAGNOSIS — G43009 Migraine without aura, not intractable, without status migrainosus: Secondary | ICD-10-CM | POA: Diagnosis not present

## 2022-11-19 MED ORDER — ONABOTULINUMTOXINA 100 UNITS IJ SOLR
200.0000 [IU] | Freq: Once | INTRAMUSCULAR | Status: AC
Start: 2022-11-19 — End: 2022-11-19
  Administered 2022-11-19: 155 [IU] via INTRAMUSCULAR

## 2022-11-19 NOTE — Progress Notes (Signed)
[facility]ulinum Clinic  ° °Procedure Note Botox ° °Attending: Dr. Malacai Grantz ° °Preoperative Diagnosis(es): Chronic migraine ° °Consent obtained from: The patient °Benefits discussed included, but were not limited to decreased muscle tightness, increased joint range of motion, and decreased pain.  Risk discussed included, but were not limited pain and discomfort, bleeding, bruising, excessive weakness, venous thrombosis, muscle atrophy and dysphagia.  Anticipated outcomes of the procedure as well as he risks and benefits of the alternatives to the procedure, and the roles and tasks of the personnel to be involved, were discussed with the patient, and the patient consents to the procedure and agrees to proceed. A copy of the patient medication guide was given to the patient which explains the blackbox warning. ° °Patients identity and treatment sites confirmed Yes.  . ° °Details of Procedure: °Skin was cleaned with alcohol. Prior to injection, the needle plunger was aspirated to make sure the needle was not within a blood vessel.  There was no blood retrieved on aspiration.   ° °Following is a summary of the muscles injected  And the amount of Botulinum toxin used: ° °Dilution °200 units of Botox was reconstituted with 4 ml of preservative free normal saline. °Time of reconstitution: At the time of the office visit (<30 minutes prior to injection)  ° °Injections  °155 total units of Botox was injected with a 30 gauge needle. ° °Injection Sites: °L occipitalis: 15 units- 3 sites  °R occiptalis: 15 units- 3 sites ° °L upper trapezius: 15 units- 3 sites °R upper trapezius: 15 units- 3 sits          °L paraspinal: 10 units- 2 sites °R paraspinal: 10 units- 2 sites ° °Face °L frontalis(2 injection sites):10 units   °R frontalis(2 injection sites):10 units         °L corrugator: 5 units   °R corrugator: 5 units           °Procerus: 5 units   °L temporalis: 20 units °R temporalis: 20 units  ° °Agent:  °200 units of botulinum Type  A (Onobotulinum Toxin type A) was reconstituted with 4 ml of preservative free normal saline.  °Time of reconstitution: At the time of the office visit (<30 minutes prior to injection)  ° ° ° Total injected (Units):  155 ° Total wasted (Units):  45 ° °Patient tolerated procedure well without complications.   °Reinjection is anticipated in 3 months. ° ° °

## 2022-11-28 ENCOUNTER — Other Ambulatory Visit: Payer: Self-pay | Admitting: Neurology

## 2022-12-31 ENCOUNTER — Ambulatory Visit: Payer: Managed Care, Other (non HMO) | Admitting: Neurology

## 2023-01-19 ENCOUNTER — Other Ambulatory Visit: Payer: Self-pay | Admitting: Neurology

## 2023-01-20 NOTE — Progress Notes (Signed)
NEUROLOGY FOLLOW UP OFFICE NOTE  [name] [id number]  Assessment/Plan:   Migraine without aura, without status migrainosus, not intractable  Migraine with aura, without status migrainosus, not intractable, possibly triggered by recent stressor   Migraine prevention:  Botox, Trokendi XR 100mg  at bedtime Migraine rescue:  Ubrelvy 100mg .  Will have her try samples of Zavzpret instead.  Will resubmit for coverage of Elyxyb.  Zofran Limit use of pain relievers to no more than 2 days out of week to prevent risk of rebound or medication-overuse headache. Keep headache diary 5.  Follow up 6 months    Subjective:  Amy Yang is a 47 year old Caucasian woman who follows up for migraines as well as new episodes of extreme somnolence.  She is accompanied by her husband who supplements history.  MRI of brian personally reviewed.   UPDATE: Insurance wouldn't cover Elyxyb until she tried oral celecoxib.  It was ineffective.   Amy Yang is able to abort migraine after one dose most of the time (about 75%).  25% of the time, she needs to repeat dose.  Otherwise, she needs to take Elyxyb which knocks it out.  Gets a migraine a couple of times a month.  She had her first migraine with aura at the end of September.  She got up from her desk to make dinner.  While looking at the recipe, she saw a gray band moving across her visual field of both eyes.  It widened and became darker with speckled pattern.  It impeded her vision.  Lasted about 1 hour.  20 minutes later, she had a severe migraine headache.  She took the Amy Yang, Elyxyb and Zofran and went to sleep. She had a postdrome headache the next morning.  She has not had one since.  She had increased stress during the summer trying to find childcare.  She found somebody in September and stress has resolved.   Current NSAIDS:  celecoxib 50mg , Advil 600mg  Current analgesics:  none Current triptans:  none Current anti-emetic:  Zofran ODT 4mg  Current  muscle relaxants:  tizanidine 4mg  TID Current anti-anxiolytic:  none Current sleep aide:  none Current Antihypertensive medications:  none Current Antidepressant medications:  none Current Anticonvulsant medications:  Trokendi XR 100mg  at bedtime Current anti-CGRP:  Ubrelvy 100mg  Current Vitamins/Herbal/Supplements:  Magnesium, vitamin B6 Current Antihistamines/Decongestants:  Benadryl Current birth control:  Mirena Other therapy:  Botox, Acupuncture   Caffeine:  Highly sensitive to caffeine and headache trigger.  Alcohol:  no Smoker:  no Diet:  Has been eliminating possible food triggers and slowly reintroducing them Exercise:  routine Depression/anxiety:  okay Sleep hygiene:  ok.  Son sleeping through night.   HISTORY: Migraines: Onset:  Since 5th grade Location:  Either side Quality:  Throbbing/stabbing Initial Intensity:  8/10 Aura:  no Prodrome:  no Associated symptoms:  Nausea, photophobia.  Some blurred vision.  No vomiting.  She has not had any new worse headache of her life, waking up from sleep Initial Duration:  Up to 48 hours Initial Frequency:  6 days a month of severe migraine but has daily headache Initial Frequency of abortive medication: only as needed. Triggers:  Change in weather, hormonal/menstrual cycle, caffeine, MSG, lemons, wine Relieving factors:  Massage, ice, Tylenol, Bendaryl Activity: aggravates headache.  Needs to lay down for severe migraines. She does have daily bi-temporal non-throbbing vice-like headache as well.   Past NSAIDS:  Ibuprofen, naproxen Past analgesics:  Tramadol, Fioricet Past abortive triptans:  Sumatriptan (tablet/NS/[place]), Relpax, Zomig.  Treximet (most effective), rizatriptan Past ergot:  Trudhesa Past anti-emetic:  Reglan Past antihypertensive medications: labetolol (low blood pressure) Past antidepressant medications:  Nortriptyline, venlafaxine Past anticonvulsant medications:  Topiramate 100mg  (effective but side  effects) Past CGRP inhibitor:  Nurtec (moderate efficacy) Past vitamins/Herbal/Supplements:  no Other past therapies:   trigger point injections, biofeedback  Family history of headache:  No   Other: In early December 2023, she was treated with antibiotics for bronchitis.  In mid-December, she was sitting in church when she suddenly developed dry mouth and felt somnolent, easily falling asleep.  She had to excuse herself to drink water.  Symptoms lasted an hour.  She had two other similar episodes later in December.  On 1/3, she was driving to the office when she had another episode.  She developed dry mouth and had trouble staying awake.  She made it to work but does not recall the drive.  They started becoming daily.  They usually will start 40 to 90 minutes after waking up in the morning.  She feels fine when she wakes up but will then suddenly feel somnolent with dry mouth.  She also will have blurred and spotty vision.  She also notes associated vertigo and lightheadedness with sensation that she may pass out.  Sometimes her scalp will tingle.  She feels weak and out of breath. She also endorses ringing in her ears.  Symptoms tend to last 90 minutes.  She once had left arm numbness and another time right leg heaviness, numbness and tingling for 10-15 seconds after the episode resolved.  As these episodes occur in the morning, she moved working out in the afternoons.  She notes weakness and more difficulty using her weights.  She also notes mild joint pain but no myalgias.  There is no associated headache and they are not associated with her migraines.  A couple of times, she has had difficulty sleeping because her heart starting racing. She started taking naps during the day, usually for an hour.  However, on the weekends, she has napped for several hours and she is still able to sleep through the night.  Denies drop attacks or sleep paralysis.  In January 2024, she developed onset of ringing in the  ears which have been persistent ever since.  PCP check labs, CBC, CMP and TSH, which were unremarkable.  Underwent extensive workup for her recent symptoms.  MRIs, EEGs and labs have been performed.  She was referred to Sleep Medicine and underwent MSLT and NPSG, which were normal.  ANA was mildly elevated but ENA panel, sed rate, CRP, RF, ANCA, CK and aldolase were negative.  Other labs unremarkable, including B1, B12, TSH, vit D, Lyme.  She saw an integrated health doctor  who started her on naltrexone to "flush out" inflammation.  She subsequently was found to have mono, which appeared to be the cause of her symptoms.  She is now doing well.    03/31/2022 MRI BRAIN W WO:  1. No acute intracranial abnormality. 2. Few punctate foci of T2 hyperintensity within the white matter of the bilateral frontal lobes, nonspecific. Differential diagnosis include migraine, demyelinating disease and post inflammatory/infectious processes. 3. Mild diffuse decrease of the T1 signal of the visualized upper cervical spine, may be related to red marrow reconversion. Correlate with CBC.  PAST MEDICAL HISTORY: Past Medical History:  Diagnosis Date   Bronchitis    Encounter for infertility    Kidney stones    Kidney stones    Migraine  Pneumonia    Preterm labor     MEDICATIONS: Current Outpatient Medications on File Prior to Visit  Medication Sig Dispense Refill   botulinum toxin Type A (BOTOX) 200 units injection INJECT 155 UNITS INTO THE MUSCLES OF MULTIPLE SITES OF THE HEAD, NECK AND FACE EVERY 90 DAYS (DISCARD UNUSED PORTION) 1 each 4   celecoxib (CELEBREX) 50 MG capsule Take 1 capsule (50 mg total) by mouth 2 (two) times daily as needed for pain. 15 capsule 0   Galcanezumab-gnlm (EMGALITY) 120 MG/ML SOAJ Inject 120 mg into the skin every 28 (twenty-eight) days. 1.12 mL 11   naltrexone (DEPADE) 50 MG tablet Take 3 mg by mouth daily.     ondansetron (ZOFRAN ODT) 4 MG disintegrating tablet Take 1 tablet (4 mg  total) by mouth every 8 (eight) hours as needed for nausea or vomiting. 20 tablet 5   spironolactone (ALDACTONE) 25 MG tablet Take by mouth.     tiZANidine (ZANAFLEX) 4 MG capsule Take 4 mg by mouth 3 (three) times daily.     Topiramate ER (TROKENDI XR) 100 MG CP24 TAKE 1 CAPSULE BY MOUTH EVERYDAY AT BEDTIME 90 capsule 1   Ubrogepant (UBRELVY) 100 MG TABS TAKE 1 TABLET BY MOUTH AS NEEDED (MAY REPEAT DOSE IN 2 HOURS. MAXIMUM 2 TABLETS IN 24 HOURS). 10 tablet 2   valACYclovir (VALTREX) 1000 MG tablet Take 1,000 mg by mouth 2 (two) times daily as needed.     [DISCONTINUED] SUMAtriptan-naproxen (TREXIMET) 85-500 MG tablet Take 1 tablet by mouth every 2 (two) hours as needed for migraine. Maximum 2 tablets in 24 hours.  Hold breastfeeding for 12 hours after use. (Patient not taking: Reported on 02/06/2020) 10 tablet 2   No current facility-administered medications on file prior to visit.    ALLERGIES: Allergies  Allergen Reactions   Other Anaphylaxis   Latex    Morphine And Codeine Nausea And Vomiting   Pineapple    Rhinocort [Budesonide]    Symbicort [Budesonide-Formoterol Fumarate] Swelling    FAMILY HISTORY: Family History  Problem Relation Age of Onset   Heart disease Father    Asthma Sister    Cancer Maternal Grandmother    Cancer Maternal Grandfather    Cancer Paternal Grandmother    Diabetes Paternal Grandmother    Stroke Paternal Grandfather       Objective:  Blood pressure 119/76, pulse (!) 56, height 5\' 5"  (1.651 m), weight 139 lb 3.2 oz (63.1 kg), SpO2 98%, unknown if currently breastfeeding. General: No acute distress.  Patient appears well-groomed.   Head:  Normocephalic/atraumatic Neck:  Supple.  No paraspinal tenderness.  Full range of motion. Heart:  Regular rate and rhythm. Neuro:  Alert and oriented.  Speech fluent and not dysarthric.  Language intact.  CN II-XII intact.  Bulk and tone normal.  Muscle strength 5/5 throughout.  Deep tendon reflexes 2+ throughout.   Gait normal.  Romberg negative.    Shon Millet, DO  CC: Ardean Larsen, MD

## 2023-01-24 ENCOUNTER — Encounter: Payer: Self-pay | Admitting: Neurology

## 2023-01-24 ENCOUNTER — Telehealth: Payer: Self-pay

## 2023-01-24 ENCOUNTER — Ambulatory Visit: Payer: Managed Care, Other (non HMO) | Admitting: Neurology

## 2023-01-24 VITALS — BP 119/76 | HR 56 | Ht 65.0 in | Wt 139.2 lb

## 2023-01-24 DIAGNOSIS — G43109 Migraine with aura, not intractable, without status migrainosus: Secondary | ICD-10-CM

## 2023-01-24 DIAGNOSIS — G43009 Migraine without aura, not intractable, without status migrainosus: Secondary | ICD-10-CM

## 2023-01-24 MED ORDER — ELYXYB 120 MG/4.8ML PO SOLN: 120.0000 mg | Freq: Every day | ORAL | 11 refills | Status: AC | PRN

## 2023-01-24 NOTE — Telephone Encounter (Signed)
PA needed Elyxyb

## 2023-01-24 NOTE — Patient Instructions (Addendum)
Trokendi XR 100mg  at bedtime, Botox Instead of UBrelvy, try Zavzpret nasal spray (1 in 24 hours).  Let me know if more effective.  Will look into the Elyxyb Limit use of pain relievers to no more than 2 days out of week to prevent risk of rebound or medication-overuse headache. Keep headache diary Follow up 6 months.

## 2023-01-24 NOTE — Progress Notes (Signed)
Medication Samples have been provided to the patient.  Drug name: Zavzpret       Strength: 10 mg        Qty: 2  LOT: [id number]  Exp.Date: 1/26  Dosing instructions: as needed  The patient has been instructed regarding the correct time, dose, and frequency of taking this medication, including desired effects and most common side effects.   Amy Yang 10:57 AM 01/24/2023

## 2023-02-01 ENCOUNTER — Other Ambulatory Visit (HOSPITAL_COMMUNITY): Payer: Self-pay

## 2023-02-04 ENCOUNTER — Other Ambulatory Visit: Payer: Self-pay

## 2023-02-07 ENCOUNTER — Other Ambulatory Visit: Payer: Self-pay

## 2023-02-07 NOTE — Progress Notes (Signed)
Specialty Pharmacy Refill Coordination Note  Amy Yang is a 47 y.o. female contacted today regarding refills of specialty medication(s) Onabotulinumtoxina   Patient requested Courier to Provider Office   Delivery date: 02/10/23   Verified address: [address] [place], [address] [place] [address]   Medication will be filled on 02/09/23.

## 2023-02-09 ENCOUNTER — Other Ambulatory Visit: Payer: Self-pay

## 2023-02-13 ENCOUNTER — Other Ambulatory Visit: Payer: Self-pay | Admitting: Neurology

## 2023-02-17 ENCOUNTER — Telehealth: Payer: Self-pay | Admitting: Pharmacy Technician

## 2023-02-17 ENCOUNTER — Other Ambulatory Visit (HOSPITAL_COMMUNITY): Payer: Self-pay

## 2023-02-17 NOTE — Telephone Encounter (Signed)
Pharmacy Patient Advocate Encounter   Received notification from RX Request Messages that prior authorization for Elyxyb 120mg /4.23ml is required/requested.   Insurance verification completed.   The patient is insured through [facility] .   Per test claim: PA required; PA submitted to above mentioned insurance via CoverMyMeds Key/confirmation #/EOC [id number] Status is pending

## 2023-02-17 NOTE — Telephone Encounter (Signed)
Pharmacy Patient Advocate Encounter  Received notification from [facility] that Prior Authorization for Elyxyb has been APPROVED from 02/17/23 to 02/16/24. Spoke to pharmacy to process.Copay is $0.  Pharmacy will order to have tomorrow.   PA #/Case ID/Reference #: PA Case ID #: [id number]

## 2023-02-17 NOTE — Telephone Encounter (Signed)
PA request has been Submitted. New Encounter created for follow up. For additional info see Pharmacy Prior Auth telephone encounter from 02/17/23.

## 2023-02-18 ENCOUNTER — Other Ambulatory Visit: Payer: Self-pay | Admitting: Neurology

## 2023-02-18 ENCOUNTER — Ambulatory Visit: Payer: Managed Care, Other (non HMO) | Admitting: Neurology

## 2023-02-18 DIAGNOSIS — G43009 Migraine without aura, not intractable, without status migrainosus: Secondary | ICD-10-CM | POA: Diagnosis not present

## 2023-02-18 MED ORDER — EMGALITY 120 MG/ML ~~LOC~~ SOAJ: 120.0000 mg | SUBCUTANEOUS | 11 refills | Status: DC

## 2023-02-18 MED ORDER — TOPIRAMATE ER 100 MG PO CAP24: 1.0000 | ORAL_CAPSULE | Freq: Every day | ORAL | 3 refills | Status: DC

## 2023-02-18 MED ORDER — ONABOTULINUMTOXINA 100 UNITS IJ SOLR
200.0000 [IU] | Freq: Once | INTRAMUSCULAR | Status: AC
  Administered 2023-02-18: 155 [IU] via INTRAMUSCULAR

## 2023-02-18 NOTE — Progress Notes (Signed)

## 2023-02-21 ENCOUNTER — Other Ambulatory Visit: Payer: Self-pay | Admitting: Neurology

## 2023-02-22 ENCOUNTER — Telehealth: Payer: Self-pay | Admitting: Neurology

## 2023-02-22 ENCOUNTER — Other Ambulatory Visit: Payer: Self-pay

## 2023-02-22 MED ORDER — TOPIRAMATE ER 100 MG PO CAP24: 1.0000 | ORAL_CAPSULE | Freq: Every day | ORAL | 3 refills | Status: DC

## 2023-02-22 NOTE — Telephone Encounter (Signed)
Medication resent to the pharmacy.

## 2023-02-22 NOTE — Telephone Encounter (Signed)
Caller stated she had two scripts sent to pharmacy but pharmacy only received one. Pt needs Topiramate ER (TROKENDI XR) 100 MG

## 2023-03-30 ENCOUNTER — Telehealth: Payer: Self-pay

## 2023-03-30 NOTE — Telephone Encounter (Signed)
Per letter received from Pharmacy, PA started, an not be completed  without the clinical information needed. Please fax to [phone]

## 2023-04-05 NOTE — Telephone Encounter (Signed)
For what medication?  Also, please just message the RX Prior Auth team to prevent delay in care/processing. When you send it to [facility] individually as well, she's not able to see if someone else has already completed the work with out her looking into it. Thanks.

## 2023-04-13 ENCOUNTER — Other Ambulatory Visit: Payer: Self-pay | Admitting: Neurology

## 2023-05-02 ENCOUNTER — Other Ambulatory Visit: Payer: Self-pay

## 2023-05-02 NOTE — Progress Notes (Signed)
 Specialty Pharmacy Refill Coordination Note  Amy Yang is a 48 y.o. female contacted today regarding refills of specialty medication(s) OnabotulinumtoxinA (Botox)   Patient requested Courier to Provider Office   Delivery date: 05/11/23   Verified address: [address] [place], [address] [place] [address]   Medication will be filled on 05/10/23.

## 2023-05-06 ENCOUNTER — Other Ambulatory Visit: Payer: Self-pay | Admitting: Neurology

## 2023-05-10 ENCOUNTER — Other Ambulatory Visit: Payer: Self-pay

## 2023-05-11 ENCOUNTER — Telehealth: Payer: Self-pay | Admitting: Pharmacy Technician

## 2023-05-11 ENCOUNTER — Other Ambulatory Visit: Payer: Self-pay

## 2023-05-11 ENCOUNTER — Other Ambulatory Visit (HOSPITAL_COMMUNITY): Payer: Self-pay

## 2023-05-11 NOTE — Telephone Encounter (Signed)
 Pharmacy Patient Advocate Encounter  BotoxOne verification has been submitted. Benefit Verification #:  [id number]  Pharmacy PA has been submitted for BOTOX 200u via CoverMyMeds. INSURANCE: [facility] [employer] DATE SUBMITTED: 3.5.25 KEY: [id number] Status is pending   Medical PA has been submitted for Botox 200 via FAX: [phone].

## 2023-05-12 ENCOUNTER — Other Ambulatory Visit: Payer: Self-pay

## 2023-05-12 ENCOUNTER — Other Ambulatory Visit (HOSPITAL_COMMUNITY): Payer: Self-pay

## 2023-05-12 NOTE — Telephone Encounter (Signed)
 Pharmacy Patient Advocate Encounter- Botox BIV-Pharmacy Benefit:  PA was submitted to [facility] and has been approved through: 3.4.25 TO 3.4.26 Authorization# [id number]  Please send prescription to Specialty Pharmacy: [facility]: [phone]  Estimated Copay is: 0  Patient IS eligible for Botox Copay Card, which will make patient's copay as little as zero. Copay card will be provided to pharmacy.

## 2023-05-13 ENCOUNTER — Other Ambulatory Visit (HOSPITAL_COMMUNITY): Payer: Self-pay

## 2023-05-20 ENCOUNTER — Ambulatory Visit: Payer: Managed Care, Other (non HMO) | Admitting: Neurology

## 2023-05-20 DIAGNOSIS — G43709 Chronic migraine without aura, not intractable, without status migrainosus: Secondary | ICD-10-CM

## 2023-05-20 MED ORDER — UBRELVY 100 MG PO TABS: 1.0000 | ORAL_TABLET | ORAL | 5 refills | Status: DC | PRN

## 2023-05-20 MED ORDER — ONABOTULINUMTOXINA 100 UNITS IJ SOLR: 200.0000 [IU] | Freq: Once | INTRAMUSCULAR | Status: AC

## 2023-05-20 NOTE — Progress Notes (Signed)

## 2023-07-08 ENCOUNTER — Telehealth: Payer: Self-pay | Admitting: Neurology

## 2023-07-08 ENCOUNTER — Encounter: Payer: Self-pay | Admitting: Neurology

## 2023-07-08 NOTE — Telephone Encounter (Signed)
 Patients insurance has changed. She is going to try and upload her new card information ito her mychart so sheena will have it.

## 2023-07-08 NOTE — Telephone Encounter (Signed)
 See my chart message

## 2023-07-13 ENCOUNTER — Other Ambulatory Visit (HOSPITAL_COMMUNITY): Payer: Self-pay

## 2023-07-14 ENCOUNTER — Telehealth: Payer: Self-pay | Admitting: Pharmacy Technician

## 2023-07-14 ENCOUNTER — Other Ambulatory Visit (HOSPITAL_COMMUNITY): Payer: Self-pay

## 2023-07-14 DIAGNOSIS — G43709 Chronic migraine without aura, not intractable, without status migrainosus: Secondary | ICD-10-CM

## 2023-07-14 NOTE — Telephone Encounter (Signed)
 Pharmacy Patient Advocate Encounter  Pharmacy Benefit PA has been submitted for Botox - [phone] via CoverMyMeds.  INSURANCE: [employer] KEY/EOC/FAX: [id number] Procedure code 54098  Pending BotoxOne report require a PA Status is Pending

## 2023-07-14 NOTE — Telephone Encounter (Signed)
 Pharmacy Patient Advocate Encounter   Botox  One Portal verification has been Submitted Benefit Verification #:  [id number]   Primary Insurance: [employer] Dx Code: G43.709 J-code: Botox - Z6109 Procedure code: 60454

## 2023-07-14 NOTE — Telephone Encounter (Signed)
 PA has been submitted, and telephone encounter has been created. Please see telephone encounter dated 5.8.25.

## 2023-07-15 ENCOUNTER — Other Ambulatory Visit (HOSPITAL_COMMUNITY): Payer: Self-pay

## 2023-07-15 NOTE — Telephone Encounter (Signed)
 Pharmacy Patient Advocate Encounter- Injection via Medical Benefit:  Buy/Bill J code: Botox - Y8657 CPT code: 84696 Dx Code: G43.709  PA was submitted to [employer] and has been approved through: 5.8.25 - 5.8.26 Authorization# [id number]  Please send prescription to Specialty Pharmacy: BUY AND BILL Estimated Patient cost is: ?  Patient IS eligible for Botox - K4401 Copay Card. Copay Card can make patient's cost as little as zero.

## 2023-07-22 ENCOUNTER — Telehealth: Payer: Self-pay | Admitting: Neurology

## 2023-07-22 ENCOUNTER — Other Ambulatory Visit (HOSPITAL_COMMUNITY): Payer: Self-pay

## 2023-07-22 NOTE — Telephone Encounter (Signed)
 Patient wants to speak with Carron Clap about her Botox   She states that [employer] told her that it would be 50 % after Deductible. The deductible is 7500.0. I was able to give her the price of her last injection from back in March but did let her know that I was not sure if this would be the same price,

## 2023-07-22 NOTE — Telephone Encounter (Signed)
 Letter received from [facility] patient can use [facility] for the pharmacy.  Should we send the Botox  to [facility].   Please give the patient a call.   Injection code cost as well.

## 2023-07-25 MED ORDER — BOTOX 200 UNITS IJ SOLR: INTRAMUSCULAR | 4 refills | Status: DC

## 2023-07-25 NOTE — Addendum Note (Signed)
 Addended by: Michalene Agee on: 07/25/2023 04:30 PM   Modules accepted: Orders

## 2023-07-25 NOTE — Telephone Encounter (Signed)
 Pharmacy benefits does not cover the Botox , so the figures they are quoting are under medical benefits.

## 2023-07-25 NOTE — Telephone Encounter (Signed)
 Please see 5.8.25 encounter for further updates. Medication is not covered through pharmacy benefits, BUT covered through medical benefits.

## 2023-07-25 NOTE — Telephone Encounter (Signed)
 LMOVM for the patient, With [place] note.

## 2023-07-25 NOTE — Telephone Encounter (Addendum)
 Pharmacy Patient Advocate Encounter  Botox  One Portal verification has been completed.  Benefit Verification #:   [id number]   Dx Code: G43.709  J-code: Botox - E4540 Pending BotoxOne report require PA Procedure code: 98119 Does Not require PA  Primary Insurance: [employer] Patient's remaining deductible: $7500 Patient estimated coinsurance/copay for med: 50% CO-INSURANCE / $0 COPAY Patient estimated coinsurance/copay for admin code: 50% CO-INSURANCE / $0 COPAY  *Benefit info changes as patient continues to use their insurance benefits*

## 2023-07-29 ENCOUNTER — Other Ambulatory Visit: Payer: Self-pay | Admitting: Neurology

## 2023-08-03 ENCOUNTER — Other Ambulatory Visit: Payer: Self-pay

## 2023-08-03 ENCOUNTER — Telehealth: Payer: Self-pay | Admitting: Pharmacist

## 2023-08-03 ENCOUNTER — Other Ambulatory Visit: Payer: Self-pay | Admitting: Pharmacy Technician

## 2023-08-03 NOTE — Telephone Encounter (Signed)
 Pharmacy Patient Advocate Encounter  Received notification from [facility] that Prior Authorization for Ubrelvy  100MG  tablets has been APPROVED from 08/03/2023 to 08/02/2024   PA #/Case ID/Reference #:  [id number]

## 2023-08-03 NOTE — Progress Notes (Signed)
 Dis-enrolling patient from program on 08/03/23 due to insurance restriction.  Per Chart review date of 07/14/23; patient Botox  is not covered through pharmacy benefits BUT covered through medical benefits.  Botox  will be Buy-Bill in MD office.

## 2023-08-10 ENCOUNTER — Other Ambulatory Visit: Payer: Self-pay | Admitting: Neurology

## 2023-08-12 ENCOUNTER — Telehealth: Payer: Self-pay | Admitting: Neurology

## 2023-08-12 NOTE — Telephone Encounter (Signed)
 Patient states that she has new [employer] ins I have updated the information in the system   She needs her Emgality  and ubrelvy   prior Auth   She states that the Botox  is already been approved by [facility] please check on that as well

## 2023-08-12 NOTE — Telephone Encounter (Signed)
 Per 08/03/23 note, Note Pharmacy Patient Advocate Encounter   Received notification from [facility] that Prior Authorization for Ubrelvy  100MG  tablets has been APPROVED from 08/03/2023 to 08/02/2024      LMOVM for patient   If the insurance is the same as the one we checked on 07/14/23 I left a VM with the information of Buy and Bill  and the cost could be up to out of pocket for the patient more then $1000 since is subject to a deductible that is not met.

## 2023-08-15 ENCOUNTER — Other Ambulatory Visit (HOSPITAL_COMMUNITY): Payer: Self-pay

## 2023-08-15 ENCOUNTER — Telehealth: Payer: Self-pay | Admitting: Pharmacy Technician

## 2023-08-15 NOTE — Telephone Encounter (Signed)
 PA for Emgality  has been submitted, and telephone encounter has been created. Please see telephone encounter dated 6.9.25.

## 2023-08-15 NOTE — Telephone Encounter (Signed)
 Pharmacy Patient Advocate Encounter   Received notification from Pt Calls Messages that prior authorization for EMGALITY  120MG  is required/requested.   Insurance verification completed.   The patient is insured through [facility] .   Per test claim: PA required; PA submitted to above mentioned insurance via CoverMyMeds Key/confirmation #/EOC [id number] Status is pending

## 2023-08-16 ENCOUNTER — Other Ambulatory Visit (HOSPITAL_COMMUNITY): Payer: Self-pay

## 2023-08-16 NOTE — Telephone Encounter (Signed)
 Pharmacy Patient Advocate Encounter  Received notification from [facility] that Prior Authorization for EMGALITY  120MG  has been APPROVED from 6.10.25 to 6.10.26. Ran test claim, Copay is $35. This test claim was processed through [facility]- copay amounts may vary at other pharmacies due to pharmacy/plan contracts, or as the patient moves through the different stages of their insurance plan.   PA #/Case ID/Reference #: [id number]

## 2023-08-17 ENCOUNTER — Other Ambulatory Visit: Payer: Self-pay | Admitting: Neurology

## 2023-08-17 ENCOUNTER — Telehealth: Payer: Self-pay

## 2023-08-17 ENCOUNTER — Telehealth: Payer: Self-pay | Admitting: Neurology

## 2023-08-17 ENCOUNTER — Other Ambulatory Visit (HOSPITAL_COMMUNITY): Payer: Self-pay

## 2023-08-17 MED ORDER — TOPIRAMATE ER 100 MG PO CAP24: 1.0000 | ORAL_CAPSULE | Freq: Every day | ORAL | 3 refills | Status: DC

## 2023-08-17 NOTE — Telephone Encounter (Signed)
 Pt. Called and said she would rather have Topiramate  ER 100 mg vs Trokendi  as it is taking too long for PA, please contact back Pt. Once ordered and sent to pharmacy

## 2023-08-17 NOTE — Telephone Encounter (Signed)
 Pharmacy Patient Advocate Encounter   Received notification from RX Request Messages that prior authorization for Trokendi  XR 100MG  er capsules is required/requested.   Insurance verification completed.   The patient is insured through [facility] .   Per test claim: PA required; PA submitted to above mentioned insurance via CoverMyMeds Key/confirmation #/EOC [id number] Status is pending

## 2023-08-17 NOTE — Telephone Encounter (Signed)
 PA request has been Submitted. New Encounter has been or will be created for follow up. For additional info see Pharmacy Prior Auth telephone encounter from 08-17-2023.

## 2023-08-17 NOTE — Telephone Encounter (Signed)
 Patient advised of Dr.Jaffe note, I resent topiramate  ER [facility].

## 2023-08-18 NOTE — Telephone Encounter (Signed)
 Pharmacy Patient Advocate Encounter  Received notification from [facility] that Prior Authorization for Trokendi  XR 100MG  er capsules has been APPROVED from 08-17-2023 to 08-16-2024   PA #/Case ID/Reference #: [id number]

## 2023-08-19 ENCOUNTER — Ambulatory Visit: Admitting: Neurology

## 2023-09-19 MED ORDER — BOTOX 200 UNITS IJ SOLR: INTRAMUSCULAR | 4 refills | Status: DC

## 2023-09-19 NOTE — Telephone Encounter (Signed)
 Yes, it can be sent to [facility]

## 2023-09-19 NOTE — Addendum Note (Signed)
 Addended by: OZELL JESUSA PARAS on: 09/19/2023 09:43 AM   Modules accepted: Orders

## 2023-09-26 ENCOUNTER — Telehealth: Payer: Self-pay | Admitting: Neurology

## 2023-09-26 NOTE — Telephone Encounter (Signed)
 Pt called in to move her botox  appointment. She said there was an issue with her insurance. They were going to push the appointment back to try and see if they might change insurances.

## 2023-09-30 ENCOUNTER — Ambulatory Visit: Admitting: Neurology

## 2023-10-04 ENCOUNTER — Other Ambulatory Visit: Payer: Self-pay | Admitting: Neurology

## 2023-10-28 ENCOUNTER — Encounter: Payer: Self-pay | Admitting: Neurology

## 2023-10-28 ENCOUNTER — Ambulatory Visit (INDEPENDENT_AMBULATORY_CARE_PROVIDER_SITE_OTHER): Admitting: Neurology

## 2023-10-28 VITALS — BP 115/75 | HR 71 | Ht 67.0 in | Wt 140.0 lb

## 2023-10-28 DIAGNOSIS — M7918 Myalgia, other site: Secondary | ICD-10-CM | POA: Diagnosis not present

## 2023-10-28 DIAGNOSIS — G43009 Migraine without aura, not intractable, without status migrainosus: Secondary | ICD-10-CM

## 2023-10-28 MED ORDER — EMGALITY 120 MG/ML ~~LOC~~ SOAJ: 120.0000 mg | SUBCUTANEOUS | 11 refills | Status: DC

## 2023-10-28 MED ORDER — TIZANIDINE HCL 4 MG PO TABS: 4.0000 mg | ORAL_TABLET | Freq: Every evening | ORAL | 5 refills | Status: AC | PRN

## 2023-10-28 NOTE — Patient Instructions (Signed)
 Continue Emgality  and topiramate  ER Refer to physical medicine and rehab for trigger point injections Ubrelvy  as needed.   Limit use of pain relievers to no more than 9 days out of the month to prevent risk of rebound or medication-overuse headache. Keep headache diary Follow up 6 months.

## 2023-10-28 NOTE — Progress Notes (Signed)
 NEUROLOGY FOLLOW UP OFFICE NOTE  Amy Yang [id number]  Assessment/Plan:   Migraine without aura, without status migrainosus, not intractable  Cervical myofascial pain syndrome   Migraine prevention:  Emgality  every 28 days; Trokendi  XR 100mg  at bedtime.  As we cannot continue Botox  but she continues to have cervical myofascial pain contributing to daily tension type headache, will refer to PM&R for trigger point injections.  In meantime, she will take tizanidine  4mg  at bedtime Migraine rescue:  Ubrelvy  100mg .  She will continue to use the Elyxyb  that she has left.  Limit use of pain relievers to no more than 9 days out of the month to prevent risk of rebound or medication-overuse headache. Keep headache diary 5.  Follow up 8 months    Subjective:  Amy Yang is a 48 year old Caucasian woman who follows up for migraines   UPDATE: She has continued to do well on Botox  and Emgality .   However, her insurance now will not cover Botox  until she meets her high deductible.  Insurance will not cover Elyxyb  but she has stocked up on it.  Overall migraines are still fairly controlled.  They are occurring 4 days a month and treats with Ubrelvy  which is effective 50% of time.  They abort quickly with Elyxyb .  However, she has started having the daily dull pressure headache across the forehead and tightness/tenderness in the back of her neck and shoulders.  She is not treating with OTC analgesics or NSAIDs.   Current NSAIDS:  Elyxyb , Advil  600mg  Current analgesics:  none Current triptans:  none Current anti-emetic:  Zofran  ODT 4mg  Current muscle relaxants:  tizanidine  4mg  TID Current anti-anxiolytic:  none Current sleep aide:  none Current Antihypertensive medications:  none Current Antidepressant medications:  none Current Anticonvulsant medications:  Trokendi  XR 100mg  at bedtime Current anti-CGRP:  Ubrelvy  100mg  Current Vitamins/Herbal/Supplements:  Magnesium , vitamin B6 Current  Antihistamines/Decongestants:  Benadryl  Current birth control:  Mirena Other therapy:  Botox , Acupuncture   Caffeine :  Highly sensitive to caffeine  and headache trigger.  Alcohol:  no Smoker:  no Diet:  Has been eliminating possible food triggers and slowly reintroducing them Exercise:  routine Depression/anxiety:  okay Sleep hygiene:  ok.     HISTORY: Migraine without aura: Onset:  Since 5th grade Location:  Either side Quality:  Throbbing/stabbing Initial Intensity:  8/10 Aura:  no Prodrome:  no Associated symptoms:  Nausea, photophobia.  Some blurred vision.  No vomiting.  She has not had any new worse headache of her life, waking up from sleep Initial Duration:  Up to 48 hours Initial Frequency:  6 days a month of severe migraine but has daily headache Initial Frequency of abortive medication: only as needed. Triggers:  Change in weather, hormonal/menstrual cycle, caffeine , MSG, lemons, wine Relieving factors:  Massage, ice, Tylenol , Bendaryl Activity: aggravates headache.  Needs to lay down for severe migraines. She does have daily bi-temporal non-throbbing vice-like headache as well.  Migraine with aura: She had her first migraine with aura at the end of September.  She got up from her desk to make dinner.  While looking at the recipe, she saw a gray band moving across her visual field of both eyes.  It widened and became darker with speckled pattern.  It impeded her vision.  Lasted about 1 hour.  20 minutes later, she had a severe migraine headache.  She took the Ubrelvy , Elyxyb  and Zofran  and went to sleep. She had a postdrome headache the next morning.  Believed to have been triggered by emotional stress at the time.  So far, that is the only event.     Past NSAIDS:  Ibuprofen , naproxen , celecoxib   Past analgesics:  Tramadol, Fioricet Past abortive triptans:  Sumatriptan  (tablet/NS/Greene), Relpax, Zomig.  Treximet  (most effective), rizatriptan  Past ergot:  Trudhesa Past  anti-emetic:  Reglan  Past antihypertensive medications: labetolol (low blood pressure) Past antidepressant medications:  Nortriptyline, venlafaxine Past anticonvulsant medications:  Topiramate  100mg  (effective but side effects) Past CGRP inhibitor:  Nurtec (moderate efficacy) Past vitamins/Herbal/Supplements:  no Other past therapies:   trigger point injections, biofeedback, chiropractic medicine  Family history of headache:  No   Postviral illness: In early December 2023, she was treated with antibiotics for bronchitis.  In mid-December, she was sitting in church when she suddenly developed dry mouth and felt somnolent, easily falling asleep.  She had to excuse herself to drink water.  Symptoms lasted an hour.  She had two other similar episodes later in December.  On 1/3, she was driving to the office when she had another episode.  She developed dry mouth and had trouble staying awake.  She made it to work but does not recall the drive.  They started becoming daily.  They usually will start 40 to 90 minutes after waking up in the morning.  She feels fine when she wakes up but will then suddenly feel somnolent with dry mouth.  She also will have blurred and spotty vision.  She also notes associated vertigo and lightheadedness with sensation that she may pass out.  Sometimes her scalp will tingle.  She feels weak and out of breath. She also endorses ringing in her ears.  Symptoms tend to last 90 minutes.  She once had left arm numbness and another time right leg heaviness, numbness and tingling for 10-15 seconds after the episode resolved.  As these episodes occur in the morning, she moved working out in the afternoons.  She notes weakness and more difficulty using her weights.  She also notes mild joint pain but no myalgias.  There is no associated headache and they are not associated with her migraines.  A couple of times, she has had difficulty sleeping because her heart starting racing. She started  taking naps during the day, usually for an hour.  However, on the weekends, she has napped for several hours and she is still able to sleep through the night.  Denies drop attacks or sleep paralysis.  In January 2024, she developed onset of ringing in the ears which have been persistent ever since.  PCP check labs, CBC, CMP and TSH, which were unremarkable.  Underwent extensive workup for her recent symptoms.  MRIs, EEGs and labs have been performed.  She was referred to Sleep Medicine and underwent MSLT and NPSG, which were normal.  ANA was mildly elevated but ENA panel, sed rate, CRP, RF, ANCA, CK and aldolase were negative.  Other labs unremarkable, including B1, B12, TSH, vit D, Lyme.  She saw an integrated health doctor  who started her on naltrexone to flush out inflammation.  She subsequently was found to have Mono, which appeared to be the cause of her symptoms.  She is now doing well.    03/31/2022 MRI BRAIN W WO:  1. No acute intracranial abnormality. 2. Few punctate foci of T2 hyperintensity within the white matter of the bilateral frontal lobes, nonspecific. Differential diagnosis include migraine, demyelinating disease and post inflammatory/infectious processes. 3. Mild diffuse decrease of the T1 signal of the  visualized upper cervical spine, may be related to red marrow reconversion. Correlate with CBC.  PAST MEDICAL HISTORY: Past Medical History:  Diagnosis Date   Bronchitis    Encounter for infertility    Kidney stones    Kidney stones    Migraine    Pneumonia    Preterm labor     MEDICATIONS: Current Outpatient Medications on File Prior to Visit  Medication Sig Dispense Refill   botulinum toxin Type A  (BOTOX ) 200 units injection INJECT 155 UNITS INTO THE MUSCLES OF MULTIPLE SITES OF THE HEAD, NECK AND FACE EVERY 90 DAYS (DISCARD UNUSED PORTION) 1 each 4   Celecoxib  (ELYXYB ) 120 MG/4.8ML SOLN Take 120 mg by mouth daily as needed (Maximum 1 in 24 hours). 28.8 mL 11    Galcanezumab -gnlm (EMGALITY ) 120 MG/ML SOAJ INJECT 120 MG INTO THE SKIN EVERY 28 (TWENTY-EIGHT) DAYS. 1 mL 11   levonorgestrel (MIRENA, 52 MG,) 20 MCG/DAY IUD 1 each by Intrauterine route once.     naltrexone (DEPADE) 50 MG tablet Take 3 mg by mouth daily.     ondansetron  (ZOFRAN  ODT) 4 MG disintegrating tablet Take 1 tablet (4 mg total) by mouth every 8 (eight) hours as needed for nausea or vomiting. 20 tablet 5   spironolactone (ALDACTONE) 25 MG tablet Take 25 mg by mouth. Take 3 tabs     tiZANidine  (ZANAFLEX ) 4 MG capsule Take 4 mg by mouth 3 (three) times daily.     Topiramate  ER (TROKENDI  XR) 100 MG CP24 Take 1 capsule (100 mg total) by mouth at bedtime. 90 capsule 3   tretinoin (RETIN-A) 0.05 % cream Apply topically at bedtime.     UBRELVY  100 MG TABS TAKE 1 TABLET BY MOUTH AS NEEDED (MAY REPEAT DOSE IN 2 HOURS. MAXIMUM 2 TABLETS IN 24 HOURS). 10 tablet 5   valACYclovir  (VALTREX ) 1000 MG tablet Take 1,000 mg by mouth 2 (two) times daily as needed.     [DISCONTINUED] SUMAtriptan -naproxen  (TREXIMET ) 85-500 MG tablet Take 1 tablet by mouth every 2 (two) hours as needed for migraine. Maximum 2 tablets in 24 hours.  Hold breastfeeding for 12 hours after use. (Patient not taking: Reported on 02/06/2020) 10 tablet 2   Current Facility-Administered Medications on File Prior to Visit  Medication Dose Route Frequency Provider Last Rate Last Admin   botulinum toxin Type A  (BOTOX ) injection 200 Units  200 Units Intramuscular Once Collyn Selk R, DO        ALLERGIES: Allergies  Allergen Reactions   Other Anaphylaxis   Latex    Morphine And Codeine Nausea And Vomiting   Pineapple    Rhinocort [Budesonide]    Symbicort [Budesonide-Formoterol Fumarate] Swelling    FAMILY HISTORY: Family History  Problem Relation Age of Onset   Heart disease Father    Asthma Sister    Cancer Maternal Grandmother    Cancer Maternal Grandfather    Cancer Paternal Grandmother    Diabetes Paternal Grandmother     Stroke Paternal Grandfather       Objective:  Blood pressure 115/75, pulse 71, height 5' 7 (1.702 m), weight 140 lb (63.5 kg), SpO2 99%, unknown if currently breastfeeding. General: No acute distress.  Patient appears well-groomed.   Head:  Normocephalic/atraumatic Neck:  Supple.  bilateral paraspinal and trapezius muscle tenderness.  Full range of motion. Heart:  Regular rate and rhythm. Neuro:  Alert and oriented.  Speech fluent and not dysarthric.  Language intact.  CN II-XII intact.  Bulk and tone normal.  Muscle  strength 5/5 throughout.  Sensation to light touch intact.  Deep tendon reflexes 1+ throughout.  Gait normal.  Romberg negative.     Amy Dunnings, DO  CC: Velna Skeeter, MD

## 2023-11-14 ENCOUNTER — Encounter: Payer: Self-pay | Admitting: Physical Medicine & Rehabilitation

## 2023-11-15 ENCOUNTER — Other Ambulatory Visit: Payer: Self-pay | Admitting: Neurology

## 2023-12-06 DIAGNOSIS — L720 Epidermal cyst: Secondary | ICD-10-CM | POA: Diagnosis not present

## 2023-12-06 DIAGNOSIS — L811 Chloasma: Secondary | ICD-10-CM | POA: Diagnosis not present

## 2023-12-06 DIAGNOSIS — Z79899 Other long term (current) drug therapy: Secondary | ICD-10-CM | POA: Diagnosis not present

## 2023-12-06 DIAGNOSIS — L700 Acne vulgaris: Secondary | ICD-10-CM | POA: Diagnosis not present

## 2023-12-10 ENCOUNTER — Other Ambulatory Visit: Payer: Self-pay | Admitting: Neurology

## 2023-12-10 DIAGNOSIS — R051 Acute cough: Secondary | ICD-10-CM | POA: Diagnosis not present

## 2023-12-10 DIAGNOSIS — U071 COVID-19: Secondary | ICD-10-CM | POA: Diagnosis not present

## 2023-12-13 ENCOUNTER — Other Ambulatory Visit (HOSPITAL_COMMUNITY): Payer: Self-pay

## 2023-12-13 NOTE — Telephone Encounter (Signed)
 Pt has current PA on file until 6.20.26. Called and left msg on pharmacy line to reprocess as 30 days and it will go through. Insurance will not pay for 28 days supply.

## 2023-12-20 ENCOUNTER — Encounter: Attending: Physical Medicine & Rehabilitation | Admitting: Physical Medicine & Rehabilitation

## 2023-12-20 ENCOUNTER — Encounter: Payer: Self-pay | Admitting: Physical Medicine & Rehabilitation

## 2023-12-20 VITALS — BP 112/79 | HR 69 | Resp 16 | Wt 139.8 lb

## 2023-12-20 DIAGNOSIS — M7918 Myalgia, other site: Secondary | ICD-10-CM | POA: Insufficient documentation

## 2023-12-20 NOTE — Progress Notes (Signed)
 Subjective:    Patient ID: Amy Yang, female    DOB: 01-Feb-1976, 48 y.o.   MRN: [id number]  HPI Discussed the use of AI scribe software for clinical note transcription with the patient, who gave verbal consent to proceed.  History of Present Illness Amy Yang is a 48 year old female with chronic tension headaches and migraines who presents with headaches and neck pain. She was referred by Dr. Lonell for evaluation of her headaches and neck pain.  She has experienced continuous headaches since fifth grade, spanning over 30 years. These are described as chronic tension headaches that occasionally escalate into migraines. She manages her condition with a combination of monthly prophylactic treatments and emergency relief medications, which have kept her out of the emergency room for the past two to three years.  Her insurance recently changed, resulting in the loss of coverage for Botox , which was a significant part of her headache management plan. She and her husband are considering paying for partial injections to cover the temporal and forehead areas, but this does not address the tension in her neck and back, which Botox  previously alleviated. She has a history of trigger point injections in 2008, which were helpful, but she does not recall the duration of relief they provided.  She has tried acupuncture during her pregnancies, which helped her stay out of the emergency room. Her first pregnancy was relatively headache-free, while the second was more challenging, requiring weekly acupuncture sessions.  She follows a strict regimen to manage her migraines, including an elimination diet, hydration, regular exercise, meditation, and avoiding sugar and soy, which are significant migraine triggers for her. She uses an acupressure mat regularly and has incorporated various 'tiny habits' to maintain her health.  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 She reports photosensitivity at times but not currently.    Pain Inventory Average Pain 4 Pain Right Now 4 My pain is constant and aching  In the last 24 hours, has pain interfered with the following? General activity 2 Relation with others 0 Enjoyment of life 0 What TIME of day is your pain at its worst? evening Sleep (in general) Good  Pain is worse with: some activites Pain improves with: heat/ice and injections Relief from Meds: 3  walk without assistance ability to climb steps?  yes do you drive?  yes Do you have any goals in this area?  no  employed # of hrs/week 45+ hours aweek  No problems in this area  CT/MRI  Neurologist MD Juliene Dunnings    Family History  Problem Relation Age of Onset   Heart disease Father    Asthma Sister    Cancer Maternal Grandmother    Cancer Maternal Grandfather    Cancer Paternal Grandmother    Diabetes Paternal Grandmother    Stroke Paternal Grandfather    Social History   Socioeconomic History   Marital status: Married    Spouse name: Not on file   Number of children: 2   Years of education: Not on file   Highest education level: Master's degree (e.g., MA, MS, MEng, MEd, MSW, MBA)  Occupational History   Occupation: RECRUITER  Tobacco Use   Smoking status: Never   Smokeless tobacco: Never  Vaping Use   Vaping status: Never Used  Substance and Sexual Activity   Alcohol use: Yes    Comment: occasionally; socially   Drug use: No  Sexual activity: Yes  Other Topics Concern   Not on file  Social History Narrative   Patient is right-handed. She lives with her husband and 2 children. She drinks hot tea 2 x a week. She works out the gym 3 x a week, and walks several times a week.   Social Drivers of Corporate investment banker Strain: Not on file  Food Insecurity: Not on file  Transportation Needs: Not on file  Physical Activity: Not on file  Stress: Not on file  Social  Connections: Not on file   Past Surgical History:  Procedure Laterality Date   KIDNEY STONE SURGERY     WISDOM TOOTH EXTRACTION     Past Medical History:  Diagnosis Date   Bronchitis    Encounter for infertility    Kidney stones    Kidney stones    Migraine    Pneumonia    Preterm labor    BP 112/79   Pulse 69   Resp 16   Wt 139 lb 12.8 oz (63.4 kg)   SpO2 99%   BMI 21.90 kg/m   Opioid Risk Score:   Fall Risk Score:  `1  Depression screen [facility] 2/9     12/20/2023    1:33 PM 10/01/2015   10:46 AM  Depression screen PHQ 2/9  Decreased Interest 0 0  Down, Depressed, Hopeless 0 0  PHQ - 2 Score 0 0  Altered sleeping 0   Tired, decreased energy 0   Change in appetite 0   Feeling bad or failure about yourself  0   Trouble concentrating 0   Moving slowly or fidgety/restless 0   Suicidal thoughts 0   PHQ-9 Score 0   Difficult doing work/chores Not difficult at all     Review of Systems  Neurological:  Positive for headaches.       Objective:   Physical Exam Vitals and nursing note reviewed.  HENT:     Head: Normocephalic and atraumatic.  Eyes:     Extraocular Movements: Extraocular movements intact.     Conjunctiva/sclera: Conjunctivae normal.     Pupils: Pupils are equal, round, and reactive to light.  Musculoskeletal:        General: Tenderness present.     Cervical back: Normal range of motion. Tenderness present.  Neurological:     General: No focal deficit present.     Mental Status: She is alert and oriented to person, place, and time.     Gait: Gait normal.  Psychiatric:        Mood and Affect: Mood normal.        Behavior: Behavior normal.    Physical Exam MUSCULOSKELETAL: Tenderness on palpation of the neck, shoulder, and scapula. Tender at bilateral upper trapezius left levator scapula as well as right infraspinatus area.  No tenderness in the posterior cervical paraspinal area there is tenderness at the proximal sternocleidomastoid  bilaterally Neuro:  Eyes without evidence of nystagmus  Tone is normal without evidence of spasticity Cerebellar exam shows no evidence of ataxia on finger nose finger or heel to shin testing No evidence of trunkal ataxia  Motor strength is 5/5 in bilateral deltoid, biceps, triceps, finger flexors and extensors, wrist flexors and extensors,   Cranial nerves II- Visual fields are intact to confrontation testing, no blurring of vision III- no evidence of ptosis, upward, downward and medial gaze intact IV- no vertical diplopia or head tilt V- no facial numbness or masseter weakness VI- no pupil abduction  weakness VII- no facial droop, good lid closure VII- normal auditory acuity IX- no pharygeal weakness, gag nl X- no pharyngeal weakness, no hoarseness XI- no trap or SCM weakness XII- no glossal weakness '       Assessment & Plan:  Assessment and Plan Assessment & Plan Chronic tension-type headache and migraine Chronic tension-type headache with superimposed migraines managed with medications. Botox  was effective but discontinued due to insurance issues. Partial cosmetic Botox  self-funded but inadequate for neck and back tension. Previous trigger point injections and acupuncture provided some relief. Current management includes lifestyle modifications. - Continue current prophylactic and emergency relief medications. - Explore insurance reimbursement programs for Botox .  Myofascial pain syndrome Chronic myofascial pain in neck, upper trapezius, and scapular regions. Previous trigger point injections provided relief. Potential overlap with cervical dystonia considered but not diagnosed. - [employer] approval for trigger point injections. - Schedule trigger point injections targeting infraspinatus, levator scapula, and bilateral upper trapezius. - Evaluate response to trigger point injections and adjust treatment plan. - Consider physical therapy exercises to complement  trigger point injections.  We discussed alternative diagnoses of cervical dystonia.  She does not have any cervical postural issues but she does have pain and tenderness over the proximal SCM which is usually not seen with cervical myofascial pain.  We will monitor  response to trigger point injections, if no significant response will administer TWISTER

## 2023-12-30 ENCOUNTER — Ambulatory Visit: Admitting: Neurology

## 2024-01-26 ENCOUNTER — Encounter: Payer: Self-pay | Admitting: Physical Medicine & Rehabilitation

## 2024-01-26 ENCOUNTER — Encounter: Attending: Physical Medicine & Rehabilitation | Admitting: Physical Medicine & Rehabilitation

## 2024-01-26 VITALS — BP 109/65 | HR 72 | Ht 67.0 in | Wt 139.0 lb

## 2024-01-26 DIAGNOSIS — M7918 Myalgia, other site: Secondary | ICD-10-CM | POA: Diagnosis not present

## 2024-01-26 MED ORDER — LIDOCAINE HCL 1 % IJ SOLN: 10.0000 mL | Freq: Once | INTRAMUSCULAR | Status: AC

## 2024-01-26 NOTE — Progress Notes (Signed)
 Trigger Point Injection  Indication: Cervical and paeriscapular Myofascial pain not relieved by medication management and other conservative care.  Informed consent was obtained after describing risk and benefits of the procedure with the patient, this includes bleeding, bruising, infection and medication side effects.  The patient wishes to proceed and has given written consent.  The patient was placed in a Seated position.  The Right infraspinatus, levator scapula, cervical paraspinal at C5 and bilateral proximal longissimus capitus area was marked and prepped with Betadine.  It was entered with a 25-gauge 1-1/2 inch needle and 1 mL of 1% lidocaine  was injected into each of 5 trigger points except 1/2 mL at each longissimus, after negative draw back for blood.  The patient tolerated the procedure well.  Post procedure instructions were given.

## 2024-02-20 ENCOUNTER — Other Ambulatory Visit: Payer: Self-pay | Admitting: Neurology

## 2024-02-24 ENCOUNTER — Telehealth: Payer: Self-pay | Admitting: Neurology

## 2024-02-24 ENCOUNTER — Other Ambulatory Visit: Payer: Self-pay

## 2024-02-24 DIAGNOSIS — B009 Herpesviral infection, unspecified: Secondary | ICD-10-CM | POA: Diagnosis not present

## 2024-02-24 DIAGNOSIS — E7800 Pure hypercholesterolemia, unspecified: Secondary | ICD-10-CM | POA: Diagnosis not present

## 2024-02-24 DIAGNOSIS — Z8709 Personal history of other diseases of the respiratory system: Secondary | ICD-10-CM | POA: Diagnosis not present

## 2024-02-24 DIAGNOSIS — G43909 Migraine, unspecified, not intractable, without status migrainosus: Secondary | ICD-10-CM | POA: Diagnosis not present

## 2024-02-24 MED ORDER — TOPIRAMATE 100 MG PO TABS
100.0000 mg | ORAL_TABLET | Freq: Every day | ORAL | 3 refills | Status: AC
  Filled 2024-02-24 – 2024-03-14 (×2): qty 30, 30d supply, fill #0
  Filled 2024-04-10: qty 30, 30d supply, fill #1

## 2024-02-24 NOTE — Telephone Encounter (Signed)
 Amy Yang called in stating that the pharmacy downstairs does have the topamax  100 mg available, with a 30 day supply, and its affordable.

## 2024-02-24 NOTE — Telephone Encounter (Signed)
 Pt called in this morning and she would like a refill on her prescription called: Galcanezumab -gnlm (EMGALITY ) 120 MG/ML SOAJ , UBRELVY  100 MG TABS , and Celecoxib  (ELYXYB ) . Pt would like to get these refills this month due to her insurance will change in January 2026. Pt is starting to travel started March 06, 2024 so she needs her prescriptions  filled. Thanks

## 2024-02-24 NOTE — Addendum Note (Signed)
 Addended by: OZELL JESUSA PARAS on: 02/24/2024 01:55 PM   Modules accepted: Orders

## 2024-02-24 NOTE — Telephone Encounter (Signed)
 PER Dr.Jaffe, Yes, you may send it to the downstairs pharmacy. Thank you

## 2024-02-24 NOTE — Telephone Encounter (Signed)
 Patient has to change insurance to the [place]. So she will need to get a Referral to continue to see Dr.Jaffe.  Patient wanted to know what she should do about her medications.   I advised patient for the Topiramate  we can see if the [facility] pharmacy has it for cheaper. But I do know it will need to be changed from [facility] for her to pick up there for cheap.     Patient wanted to know if that could be sent in to the Pharmacy downstairs.

## 2024-02-24 NOTE — Telephone Encounter (Signed)
 Per Rep Emgality  is ready.

## 2024-03-05 ENCOUNTER — Telehealth: Payer: Self-pay | Admitting: Neurology

## 2024-03-05 NOTE — Telephone Encounter (Signed)
 Amy Yang called in stating that she was suppose to come to the office to get Emgality , but has not been able to due to having the flu. She stated when she gets over the Flu that she will come and get it.

## 2024-03-07 ENCOUNTER — Other Ambulatory Visit: Payer: Self-pay

## 2024-03-14 ENCOUNTER — Other Ambulatory Visit: Payer: Self-pay

## 2024-03-15 ENCOUNTER — Encounter: Attending: Physical Medicine & Rehabilitation | Admitting: Physical Medicine & Rehabilitation

## 2024-03-15 ENCOUNTER — Other Ambulatory Visit: Payer: Self-pay

## 2024-03-15 MED ORDER — SPIRONOLACTONE 25 MG PO TABS
75.0000 mg | ORAL_TABLET | Freq: Every day | ORAL | 11 refills | Status: AC
  Filled 2024-03-15: qty 90, 30d supply, fill #0
  Filled 2024-04-10: qty 90, 30d supply, fill #1

## 2024-03-15 MED ORDER — SPIRONOLACTONE 25 MG PO TABS
75.0000 mg | ORAL_TABLET | Freq: Every day | ORAL | 11 refills | Status: DC
  Filled 2024-03-15: qty 90, 30d supply, fill #0

## 2024-06-28 ENCOUNTER — Ambulatory Visit: Admitting: Neurology
